# Patient Record
Sex: Female | Born: 1965 | Race: White | Hispanic: No | Marital: Married
Health system: Midwestern US, Academic
[De-identification: ages and names within clinical notes are randomized; demographics above are authoritative.]

---

## 2021-07-30 ENCOUNTER — Encounter: Admit: 2021-07-30 | Discharge: 2021-07-30 | Payer: MEDICARE

## 2021-07-31 ENCOUNTER — Encounter: Admit: 2021-07-31 | Discharge: 2021-07-31 | Payer: MEDICARE

## 2021-07-31 DIAGNOSIS — C20 Malignant neoplasm of rectum: Secondary | ICD-10-CM

## 2021-07-31 NOTE — Progress Notes
H&E Slides of ACC # [id number]/09/20 & [id number]/16/21, requested on 07/31/21 from [facility].    Contact for outside path lab is [phone].      FED EX shipping label faxed (fax # [phone]) with shipment tracking # [phone].

## 2021-07-31 NOTE — Telephone Encounter
Navigation Intake Assessment Document    Patient Name:  Terri Eaton  DOB:  Aug 24, 1965  Insurance:   [facility] Medicare     Appointment Info:   Future Appointments   Date Time Provider Department Center   08/16/2021  1:30 PM [place], Humberto Leep, DO CCC2 [place] Exam     Diagnosis & Reason for Visit:  Hx rectal cancer, needing Dr. Kathee Delton opinion.     Physician Info:   ? Referring Physician: Dr Halina Andreas     Location of Films:  IMAGE VIEWER    Location of Pathology:  OUtside path requested for review and Patient notified outside pathology slides will be obtained for review by [facility] pathologist and a facility and professional fee will be billed to their insurance.     11/09/2020 [id number] H & E     05/16/2021 [id number] H &E    History of Present Illness:  Hx rectal cancer. Case presented in tumor board at [facility]. Referred to Dr. Kathee Delton for opinion.     11/09/2020 colonoscopy large polypoid mass proximal to the dentate line.   Path Invasive moderately differentiated adenocarcinoma.     12/14/2020 CT CAP    12/11/2020 Oncology Consult     12/14/2020 MRI abdomen     01/11/2021 Radiation onc consult     05/10/2021 MRI Pelvis     05/16/2021 Exam under anesthesia abnormal mucosa   Path :   Final Diagnosis     RECTAL MUCOSA, 9:00, EXCISION:     Tubular adenoma    RECTAL MUCOSA, 7:00, EXCISION:     Tubular adenoma and regions of serrated adenoma    RECTAL MUCOSA, 9:00, DISTAL DENTATE LINE EXCISION:     Tubular adenoma and serrated adenoma    RECTAL MUCOSA, 6:00 AT THE DENTATE LINE, EXCISION:     Serrated adenoma and tubular adenoma    RECTAL MUCOSA, 4:00 AT DENTATE LINE, EXCISION:     Serrated adenoma    RECTAL MUCOSA, 10:00, EXCISION:     Tubular adenoma and serrated adenoma  ?  COMMENT: Sections show low-grade dysplasia throughout in the form of tubular adenoma and sessile serrated adenoma.  No residual invasive carcinoma is identified.  No high-grade dysplasia is identified.  The adenomatous processes involve the majority of each of the specimens although normal mucosa is noted in some of the specimens.         Allergies reviewed and verified with the patient, and documented in Epic:  No    NEEDS Assessment:    Genetic Counseling:  Genetic Assessment: GI             Nutrition:  Current Weight (in pounds): 246  Recent Weight Loss Without Trying?: No       Social & Financial:  Social and Financial Assessment: No needs identified  Tobacco assessment last 30 days: Patient has not used tobacco products within the last 30 days       Spiritual & Emotional:  Spiritual and Emotional Assessment: No needs identified       Physical:  Fall Risk: None identified       Communication:  Communication Barrier: No       Onc Fertility:   Onc Fertility Assessment: Discussion not appropriate at this time

## 2021-08-02 ENCOUNTER — Encounter: Admit: 2021-08-02 | Discharge: 2021-08-02 | Payer: MEDICARE

## 2021-08-02 DIAGNOSIS — C20 Malignant neoplasm of rectum: Principal | ICD-10-CM

## 2021-08-06 ENCOUNTER — Encounter: Admit: 2021-08-06 | Discharge: 2021-08-06 | Payer: MEDICARE

## 2021-08-06 DIAGNOSIS — C189 Malignant neoplasm of colon, unspecified: Secondary | ICD-10-CM

## 2021-08-09 ENCOUNTER — Encounter: Admit: 2021-08-09 | Discharge: 2021-08-09 | Payer: MEDICARE

## 2021-08-09 DIAGNOSIS — C20 Malignant neoplasm of rectum: Secondary | ICD-10-CM

## 2021-08-09 DIAGNOSIS — C189 Malignant neoplasm of colon, unspecified: Secondary | ICD-10-CM

## 2021-08-16 ENCOUNTER — Encounter: Admit: 2021-08-16 | Discharge: 2021-08-16 | Payer: MEDICARE

## 2021-08-16 ENCOUNTER — Ambulatory Visit: Admit: 2021-08-16 | Discharge: 2021-08-16 | Payer: MEDICARE

## 2021-08-16 DIAGNOSIS — C20 Malignant neoplasm of rectum: Secondary | ICD-10-CM

## 2021-08-16 DIAGNOSIS — M1990 Unspecified osteoarthritis, unspecified site: Secondary | ICD-10-CM

## 2021-08-16 DIAGNOSIS — C189 Malignant neoplasm of colon, unspecified: Secondary | ICD-10-CM

## 2021-08-16 DIAGNOSIS — M549 Dorsalgia, unspecified: Secondary | ICD-10-CM

## 2021-08-16 LAB — CBC
HEMATOCRIT: 37 % (ref 36–45)
MCH: 28 pg (ref 26–34)
WBC COUNT: 8.6 K/UL (ref 4.5–11.0)

## 2021-08-16 LAB — COMPREHENSIVE METABOLIC PANEL
ALK PHOSPHATASE: 72 U/L (ref 25–110)
ALT: 25 U/L (ref 7–56)
ANION GAP: 6 M/UL (ref 3–12)
CALCIUM: 8.9 mg/dL — ABNORMAL HIGH (ref 8.5–10.6)
CO2: 29 MMOL/L (ref 21–30)
GLUCOSE,PANEL: 98 mg/dL (ref 70–100)
POTASSIUM: 3.7 MMOL/L (ref 3.5–5.1)
SODIUM: 139 MMOL/L (ref 137–147)
TOTAL BILIRUBIN: 0.5 mg/dL (ref 0.3–1.2)
TOTAL PROTEIN: 7 g/dL (ref 6.0–8.0)

## 2021-08-16 MED ORDER — SODIUM CHLORIDE 0.9 % IV SOLP
250 mL | INTRAVENOUS | 0 refills
Start: 2021-08-16 — End: ?

## 2021-08-16 NOTE — Patient Instructions
[facility]  Surgery Instructions    Surgery Date: 08/20/21  (Please be aware that this appointment for your surgery will not be visible in the MyChart portal.)    Stop Mobic/ibuprofen/aspirin/aleve and bring CPAP day of surgery  Location:  Your surgery will be held at [facility] ([address], [place], [place] [address].)  Where to park on the day of surgery: [employer] [address] [address] (located just [place] [address] on [address])  Where to check in on the day of surgery: In the Admitting office (located on Level 1 of hospital of the [employer] [facility])      Arrival Time:   The OR scheduling office must finalize the OR schedule before we can provide a time for your surgery. Please do not call your physicians office about surgery arrival time. Your surgery start time and arrive times will be called out to you the day before your procedure between 2pm-4:30pm. If you have not heard from the hospital regarding your surgery time by 4:30PM the evening before your surgery, please call [phone].  The day of the procedure, if you cannot keep your appointment, or are delayed, please contact the surgery center immediately at [phone].      COVID-19 Testing:   No testing needed for planned surgery. If you develop any COVID-19 symptoms within 14 days of your procedure, please call [phone]. Ask to be connected to [employer] (Nurse for Dr. Kathee Delton). .     Symptoms of COVID-19 may include any of the following:  Fever or chills, Cough, Shortness of breath or difficulty breathing, Fatigue, Muscle or body aches, Headache, New loss of taste or smell, Sore throat, Congestion or runny nose, Nausea or vomiting, Diarrhea    Eating and drinking:   You must be on a clear liquid beginning the day before to surgery. Beginning at 11pm the night prior to surgery, please limit your fluid intake to only sips or small amounts (1-2oz) of water and clear liquids. ALL CLEAR LIQUIDS AND WATER must be stopped 2 hours before you need to arrive to the hospital the day of your surgery. (Be aware, you can take a small sip of water with medications the morning of surgery, if necessary.)    Bowel Prep:   MIRALAX BOWEL PREP for Procedure    This will be completed the day prior to your scheduled procedure/surgery.    Shopping List: (Purchase these over the counter supplies at least a day or two before the prep)    1)  64 ounces of a clear liquid (Gatorade Ice, Gingerale, Sprite, etc. Avoid Red, Orange & Purple)  2)  1 bottles of the 238g sized bottle of Miralax Powder   3)  8 tablets of Dulcolax    **The entire day before the procedure, beginning with your first meal of the day, drink a clear liquid diet only:          DO NOT EAT ANY SOLID or CREAMED FOODS.  Clear liquids include:  Coffee (no creamers or milk), Tea, Water  flavored drinks, Gatorade, juices (No RED ORANGE or PURPLE in color)  broth, bouillon, jello (No RED, ORANGE or PURPLE in color)    At 12 noon, take 4 tablets of Dulcolax by mouth.  At 3 PM, take 4 tablets of Dulcolax by mouth.  At 5 PM mix the entire bottle (238g) of Miralax with all 64oz of clear liquid of choice.  Try to finish drinking the entire 64 ounces of Miralax mixture in about 2 hours- this equates to drinking an 8oz glass every ten minutes.  If you need more time, take it. We would prefer you to finish the entire prep than to stop part way through.      Continue to drink lots of clear liquids to stay well hydrated and protect your kidneys.  Your urine should be light yellow.  If your urine becomes dark yellow you are not drinking enough. STOP drinking EVERYTHING 2 hours prior to your arrival to the hospital the day of surgery/procedure.    Hygiene:    Shower the night before and the morning of your procedure , you will not need to perform your showers with any special soaps, just be sure you clean on the day of your surgery.    Personal Items:    Do not wear makeup, fingernail polish, lotion, deodorant, or perfume  Remove all metal jewelry and piercings.   Bring a Retail buyer for your glasses, contacts, hearing aids, dentures, etc.     Thing to Bring:    Personal identification (ID)   Insurance card    Medications:    You will be given medication instructions by the Pre-Operative Assessment Clinic [facility]). If you have NOT been scheduled a Pre-Operative Assessment appointment, you will receive a phone call to be ?triaged? by the anesthesia team. Please be sure to speak with the anesthesia team or attend your [facility] appointment if setup for you. If you do not, this may result in cancellation or delay of your surgery.  If you did not receive medication instructions, or have questions about any of your current medications, as it relates to the day of your surgery, please call [phone].    Transportation/Visitor Policy:    During the COVID-19 crisis, for the safety of our patients, the health system has limited visitors.  Please be aware of the following:     You are having an outpatient surgery, a responsible adult you know and trust is required to drive you home and stay with you for 12-24 hours after surgery. If you do not have this arranged prior to surgery, your surgery may be delayed or cancelled. Remember, you are not allowed to drive for 24 hours after surgery.  A maximum of 2 total visitors are allowed during your hospital admission each day.   During the time of your surgery, due to the size of the perioperative waiting room, only 1 visitor is allowed in the designated waiting room.  If you have 2 visitors during the time of your surgery, please know your visitors will be asked to find a place to wait in the hospital lobby or cafeteria and will be notified by the nurse liaison team, when you are out of surgery.  No visitors are allowed in pre/post patient care areas until a patient is transferred to a patient room.  If you would like more than 1 visitor to be at the hospital during surgery, please be aware you are only allowed   No visitors are allowed for patients with active COVID-19 infections.    All visitors must:     Wear a mask at all times until further notice.  Maintain physical distancing of at least six feet from others.  Wash hands or use hand sanitizer when entering or exiting patient rooms.   Any visitors who have a fever or other cold- or flu-like symptoms will not be allowed in [facility]  [employer] facilities.     Thank you for understanding our visitation policies. We will continue to evaluate our recommendations and keep you updated on revisions based on public health guidance.       FMLA:   If you have any FMLA or Short Term Disability Paperwork needs, you may have these faxed to [phone]- Attention to Marni Griffon, RN. Be sure your name and birthday are on the forms, along with a return fax number for this office to return them to once completed. Please allow up to 7-10 business days for completion of paperwork.     Billing Questions:   A member of the business office will verify your medical benefits with your insurance company prior to your surgery. If you have questions about understanding the cost of your care please call the Financial Customer Service at [phone].       If you have questions, please call [phone]. Ask to be connected to [employer] (Nurse for Dr. Kathee Delton).     If your needs are URGENT and it?s after hours   please call [phone] and have the on call provider with Surgical Oncology paged.

## 2021-08-19 ENCOUNTER — Encounter: Admit: 2021-08-19 | Discharge: 2021-08-19 | Payer: MEDICARE

## 2021-08-20 ENCOUNTER — Encounter: Admit: 2021-08-20 | Discharge: 2021-08-20 | Payer: MEDICARE

## 2021-08-20 ENCOUNTER — Ambulatory Visit: Admit: 2021-08-20 | Discharge: 2021-08-20 | Payer: MEDICARE

## 2021-08-20 DIAGNOSIS — M1990 Unspecified osteoarthritis, unspecified site: Secondary | ICD-10-CM

## 2021-08-20 DIAGNOSIS — M549 Dorsalgia, unspecified: Secondary | ICD-10-CM

## 2021-08-20 DIAGNOSIS — C189 Malignant neoplasm of colon, unspecified: Secondary | ICD-10-CM

## 2021-08-20 MED ORDER — PROPOFOL INJ 10 MG/ML IV VIAL
INTRAVENOUS | 0 refills | Status: DC
Start: 2021-08-20 — End: 2021-08-20
  Administered 2021-08-20: 17:00:00 150 mg via INTRAVENOUS
  Administered 2021-08-20: 17:00:00 50 mg via INTRAVENOUS

## 2021-08-20 MED ORDER — ARTIFICIAL TEARS (PF) SINGLE DOSE DROPS GROUP
OPHTHALMIC | 0 refills | Status: DC
Start: 2021-08-20 — End: 2021-08-20
  Administered 2021-08-20: 17:00:00 1 [drp] via OPHTHALMIC

## 2021-08-20 MED ORDER — PHENYLEPHRINE HCL IN 0.9% NACL 1 MG/10 ML (100 MCG/ML) IV SYRG
INTRAVENOUS | 0 refills | Status: DC
Start: 2021-08-20 — End: 2021-08-20
  Administered 2021-08-20: 17:00:00 100 ug via INTRAVENOUS
  Administered 2021-08-20: 17:00:00 200 ug via INTRAVENOUS

## 2021-08-20 MED ORDER — LACTATED RINGERS IV SOLP
INTRAVENOUS | 0 refills | Status: DC
Start: 2021-08-20 — End: 2021-08-20
  Administered 2021-08-20: 16:00:00 via INTRAVENOUS

## 2021-08-20 MED ORDER — ONDANSETRON HCL (PF) 4 MG/2 ML IJ SOLN
INTRAVENOUS | 0 refills | Status: DC
Start: 2021-08-20 — End: 2021-08-20
  Administered 2021-08-20: 17:00:00 4 mg via INTRAVENOUS

## 2021-08-20 MED ORDER — MIDAZOLAM 1 MG/ML IJ SOLN
INTRAVENOUS | 0 refills | Status: DC
Start: 2021-08-20 — End: 2021-08-20
  Administered 2021-08-20: 16:00:00 2 mg via INTRAVENOUS

## 2021-08-20 MED ORDER — LIDOCAINE (PF) 200 MG/10 ML (2 %) IJ SYRG
INTRAVENOUS | 0 refills | Status: DC
Start: 2021-08-20 — End: 2021-08-20
  Administered 2021-08-20: 17:00:00 100 mg via INTRAVENOUS

## 2021-08-20 MED ORDER — DEXAMETHASONE SODIUM PHOSPHATE 4 MG/ML IJ SOLN
INTRAVENOUS | 0 refills | Status: DC
Start: 2021-08-20 — End: 2021-08-20
  Administered 2021-08-20: 17:00:00 4 mg via INTRAVENOUS

## 2021-08-20 MED ADMIN — BUPIVACAINE HCL 0.25 % (2.5 MG/ML) IJ SOLN [1222]: 10 mL | INTRAMUSCULAR | @ 17:00:00 | Stop: 2021-08-20 | NDC 63323046501

## 2021-08-20 MED ADMIN — SODIUM CHLORIDE 0.9 % IV SOLP [27838]: 250 mL | INTRAVENOUS | @ 16:00:00 | Stop: 2021-08-20 | NDC 00338004902

## 2021-08-20 MED ADMIN — LIDOCAINE HCL 10 MG/ML (1 %) IJ SOLN [4452]: 10 mL | INTRAMUSCULAR | @ 17:00:00 | Stop: 2021-08-20 | NDC 00409427617

## 2021-08-20 MED FILL — HYDROCODONE-ACETAMINOPHEN 5-325 MG PO TAB: 5/325 mg | ORAL | 3 days supply | Qty: 10 | Fill #1 | Status: CP

## 2021-08-22 ENCOUNTER — Encounter: Admit: 2021-08-22 | Discharge: 2021-08-22 | Payer: MEDICARE

## 2021-08-22 DIAGNOSIS — M549 Dorsalgia, unspecified: Secondary | ICD-10-CM

## 2021-08-22 DIAGNOSIS — C189 Malignant neoplasm of colon, unspecified: Secondary | ICD-10-CM

## 2021-08-22 DIAGNOSIS — M1990 Unspecified osteoarthritis, unspecified site: Secondary | ICD-10-CM

## 2021-09-03 NOTE — Progress Notes
..This prechart is intended to be a reference for patient appointments. Information is gathered from in chart as well as external records review. Information will be clarified/verified/updated in final documentation in the office visit.     Pre Clinic Pre Chart:    Terri Eaton is a 3F with a history of rectal cancer just above the dentate line that has been previously trans-anally excised with residual adenomatous tumor. She underwent a repeat transanal excision on 08/20/2021. 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 It was removed in pieces and cautery limited margin evaluation.     Problem List:      08/20/2021 - 1. Anorectal exam under anesthesia 2. Transanal excision of rectal tumor complex and circumferential  Findings:  Circumferential rectal tumor, excised in pieces.  Pathology:  A. 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 See comment.     Comment:   There is a small focus of intramucosal adenocarcinoma with prominent   cautery artifact that partly limits evaluation. No definitive submucosal   invasion is identified.     CASE SUMMARY: (COLON AND RECTUM: Resection, Including Transanal Disk   Excision of Rectal Neoplasms)   CAP Version: Colon and Rectum Resection 4.2.0.2   Standard(s): AJCC-UICC 8     SPECIMEN     Procedure   ___ Transanal disk excision (local excision)     Macroscopic Evaluation of Mesorectum (required for rectal cancers)   ___ Not applicable     TUMOR     Tumor Site (select all that apply)   ___ Rectum: _________________     Histologic Type   ___ Adenocarcinoma, intramucosal     Histologic Grade   ___ G2, moderately differentiated     Tumor Size   ___ Greatest dimension in Centimeters (cm): 0.25 cm of intramucosal   adenocarcinoma in this material     Multiple Primary Sites (e.g., hepatic flexure and transverse colon)   ___ Not applicable (no additional primary site(s) present)     Tumor Extent   ___ Invades lamina propria / muscularis mucosae (intramucosal carcinoma)     Macroscopic Tumor Perforation   ___ Not identified     Lymphovascular Invasion(select all that apply)   ___ Not identified     Perineural Invasion   ___ Not identified     +Type of Polyp in which Invasive Carcinoma Arose   ___ Tubulovillous adenoma     Treatment Effect   ___ No known presurgical therapy     MARGINS     Margin Status for Invasive Carcinoma   The fragmented nature of the specimen precludes assessment of mucosal   margins. The deep margin is uninvolved (2.5 mm away).     REGIONAL LYMPH NODES     Regional Lymph Node Status   ___ Not applicable (no regional lymph nodes submitted or found)     Tumor Deposits   ___Not applicable     DISTANT METASTASIS     Distant Site(s) Involved, if applicable(select all that apply)   ___ Not applicable     PATHOLOGIC STAGE CLASSIFICATION (pTNM, AJCC 8th Edition): pTis   Reporting of pT, pN, and (when applicable) pM categories is based on   information available to the pathologist at the time the report is issued.   As per the AJCC (Chapter 1, 8th Ed.) it is the managing physician's   responsibility to establish the  final pathologic stage based upon all   pertinent information, including but potentially not limited to this   pathology report.     TNM Descriptors(select all that apply)   ___ Not applicable     pT Category   ___ pTis: Carcinoma in situ, intramucosal carcinoma (involvement of lamina   propria with no extension through muscularis mucosae)     pN Category   ___ pN not assigned (no nodes submitted or found)     pM Category (required only if confirmed pathologically)   ___ Not applicable - pM cannot be determined from the submitted   specimen(s)     ADDITIONAL FINDINGS     +Additional Findings(select all that apply)   ___ None identified     SPECIAL STUDIES   A desmin immunostain on block A3 highlights the muscularis mucosa.     COMMENTS   Pursuant to the Quality Assurance Program at the [facility] Pathology Department, selected slides from this case have been   concurrently reviewed by the following pathologist: Dr. Georgiann Cocker who   agrees with the final diagnosis. ?

## 2021-09-09 ENCOUNTER — Encounter: Admit: 2021-09-09 | Discharge: 2021-09-09 | Payer: MEDICARE

## 2021-09-09 ENCOUNTER — Ambulatory Visit: Admit: 2021-09-09 | Discharge: 2021-09-09 | Payer: MEDICARE

## 2021-09-09 DIAGNOSIS — D369 Benign neoplasm, unspecified site: Secondary | ICD-10-CM

## 2021-09-09 DIAGNOSIS — C210 Malignant neoplasm of anus, unspecified: Secondary | ICD-10-CM

## 2021-09-09 DIAGNOSIS — M1990 Unspecified osteoarthritis, unspecified site: Secondary | ICD-10-CM

## 2021-09-09 DIAGNOSIS — M549 Dorsalgia, unspecified: Secondary | ICD-10-CM

## 2021-09-09 DIAGNOSIS — C189 Malignant neoplasm of colon, unspecified: Secondary | ICD-10-CM

## 2021-09-09 MED ORDER — SODIUM CHLORIDE 0.9 % IV SOLP
250 mL | INTRAVENOUS | 0 refills
Start: 2021-09-09 — End: ?

## 2021-09-09 NOTE — Progress Notes
Date of Service: 09/09/2021    Subjective:             Terri Eaton is a 56 y.o. female.    History of Present Illness  Terri Eaton is a 59F with a history of rectal cancer just above the dentate line that has been previously trans-anally excised with residual adenomatous tumor. She underwent a repeat transanal excision on 08/20/2021. 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 It was removed in pieces and cautery limited margin evaluation.     She returns to clinic today for post operative visit. The first week was very painful. Now reporting just some tenderness. Mostly when she squeezes down there. Rinses with warm water which helps the pain. There is a little bit of bleeding. She is able to control her bowel movements. Backed off the Miralax and now takes one softener a day.     08/20/2021 - 1. Anorectal exam under anesthesia 2. Transanal excision of rectal tumor complex and circumferential  Findings:  Circumferential rectal tumor, excised in pieces.  Pathology:  A. 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 See comment.     Comment:   There is a small focus of intramucosal adenocarcinoma with prominent   cautery artifact that partly limits evaluation. No definitive submucosal   invasion is identified.     CASE SUMMARY: (COLON AND RECTUM: Resection, Including Transanal Disk   Excision of Rectal Neoplasms)   CAP Version: Colon and Rectum Resection 4.2.0.2   Standard(s): AJCC-UICC 8     SPECIMEN     Procedure   ___ Transanal disk excision (local excision)     Macroscopic Evaluation of Mesorectum (required for rectal cancers)   ___ Not applicable     TUMOR     Tumor Site (select all that apply)   ___ Rectum: _________________     Histologic Type   ___ Adenocarcinoma, intramucosal     Histologic Grade   ___ G2, moderately differentiated     Tumor Size ___ Greatest dimension in Centimeters (cm): 0.25 cm of intramucosal   adenocarcinoma in this material     Multiple Primary Sites (e.g., hepatic flexure and transverse colon)   ___ Not applicable (no additional primary site(s) present)     Tumor Extent   ___ Invades lamina propria / muscularis mucosae (intramucosal carcinoma)     Macroscopic Tumor Perforation   ___ Not identified     Lymphovascular Invasion(select all that apply)   ___ Not identified     Perineural Invasion   ___ Not identified     +Type of Polyp in which Invasive Carcinoma Arose   ___ Tubulovillous adenoma     Treatment Effect   ___ No known presurgical therapy     MARGINS     Margin Status for Invasive Carcinoma   The fragmented nature of the specimen precludes assessment of mucosal   margins. The deep margin is uninvolved (2.5 mm away).     REGIONAL LYMPH NODES     Regional Lymph Node Status   ___ Not applicable (no regional lymph nodes submitted or found)     Tumor Deposits   ___Not applicable     DISTANT METASTASIS     Distant Site(s) Involved, if applicable(select all that apply)   ___ Not applicable     PATHOLOGIC STAGE CLASSIFICATION (pTNM, AJCC 8th Edition): pTis   Reporting  of pT, pN, and (when applicable) pM categories is based on   information available to the pathologist at the time the report is issued.   As per the AJCC (Chapter 1, 8th Ed.) it is the managing physician's   responsibility to establish the final pathologic stage based upon all   pertinent information, including but potentially not limited to this   pathology report.     TNM Descriptors(select all that apply)   ___ Not applicable     pT Category   ___ pTis: Carcinoma in situ, intramucosal carcinoma (involvement of lamina   propria with no extension through muscularis mucosae)     pN Category   ___ pN not assigned (no nodes submitted or found)     pM Category (required only if confirmed pathologically)   ___ Not applicable - pM cannot be determined from the submitted specimen(s)     ADDITIONAL FINDINGS     +Additional Findings(select all that apply)   ___ None identified     SPECIAL STUDIES   A desmin immunostain on block A3 highlights the muscularis mucosa.     COMMENTS   Pursuant to the Quality Assurance Program at the [facility] Pathology Department, selected slides from this case have been   concurrently reviewed by the following pathologist: Dr. Georgiann Cocker who   agrees with the final diagnosis. ?       Medical History:   Diagnosis Date   ? Arthritis    ? Back pain    ? Cancer of colon [facility])      Surgical History:   Procedure Laterality Date   ? TRANSANAL EXCISION RECTAL TUMOR - FULL THICKNESS N/A 08/20/2021    Performed by Costella Hatcher, DO at [facility] OR   ? HX CARPAL TUNNEL RELEASE Bilateral    ? KNEE REPLACEMENT Right      No family history on file.  Social History     Socioeconomic History   ? Marital status: Married   Tobacco Use   ? Smoking status: Never   ? Smokeless tobacco: Never   Vaping Use   ? Vaping Use: Never used   Substance and Sexual Activity   ? Alcohol use: Yes     Alcohol/week: 1.0 standard drink     Types: 1 Cans of beer per week   ? Drug use: Never     Vaping/E-liquid Use   ? Vaping Use Never User                    Review of Systems   Constitutional: Positive for unexpected weight change. Negative for chills and fever.   HENT: Negative.    Eyes: Negative.    Respiratory: Negative.    Cardiovascular: Negative.    Gastrointestinal: Negative.  Negative for abdominal distention, abdominal pain, anal bleeding, blood in stool, constipation, diarrhea, nausea, rectal pain and vomiting.   Endocrine: Negative.    Genitourinary: Negative.    Musculoskeletal: Positive for arthralgias.   Skin: Negative.  Negative for color change and wound.   Allergic/Immunologic: Negative.    Neurological: Negative.  Negative for weakness and light-headedness.   Hematological: Negative.    Psychiatric/Behavioral: Negative.    All other systems reviewed and are negative.        Objective:         ? diphenhydrAMINE hcl (BENADRYL ALLERGY) 25 mg tablet Benadryl Allergy   ? fexofenadine (ALLEGRA) 180 mg tablet Take 180 mg by mouth at  bedtime daily.   ? gabapentin (NEURONTIN) 300 mg capsule Take 600 mg by mouth at bedtime daily.   ? HYDROcodone/acetaminophen (NORCO) 5/325 mg tablet Take one tablet by mouth every 6 hours as needed for Pain.   ? losartan (COZAAR) 50 mg tablet TAKE ONE (1) TABLET BY MOUTH ONCE DAILY for 90   ? meloxicam (MOBIC) 7.5 mg tablet Take 1 tablet by mouth twice daily.   ? pantoprazole DR (PROTONIX) 40 mg tablet TAKE ONE (1) TABLET BY MOUTH ONCE DAILY for 90   ? polyethylene glycol 3350 (MIRALAX) 17 g packet Take one packet by mouth daily.   ? promethazine (PHENERGAN) 25 mg tablet Take 25 mg by mouth every 6 hours as needed.   ? sertraline (ZOLOFT) 100 mg tablet Take 1 tablet by mouth daily.   ? tiZANidine (ZANAFLEX) 4 mg capsule Take 4 mg by mouth at bedtime daily.     Vitals:    09/09/21 0935   BP: 117/53   BP Source: Arm, Left Upper   Pulse: 70   Temp: 36.3 ?C (97.4 ?F)   Resp: 18   SpO2: 98%   TempSrc: Temporal   PainSc: Zero   Weight: 115 kg (253 lb 9.6 oz)     Body mass index is 46.38 kg/m?Marland Kitchen     Physical Exam  Vitals reviewed.   Constitutional:       General: She is not in acute distress.     Appearance: Normal appearance. She is well-developed.   HENT:      Head: Normocephalic and atraumatic.      Nose: Nose normal.   Eyes:      General: Lids are normal.      Conjunctiva/sclera: Conjunctivae normal.   Pulmonary:      Effort: Pulmonary effort is normal. No respiratory distress.   Musculoskeletal:         General: Normal range of motion.      Cervical back: Normal range of motion.   Neurological:      Mental Status: She is alert and oriented to person, place, and time.   Psychiatric:         Speech: Speech normal.         Behavior: Behavior normal.         Thought Content: Thought content normal.         Judgment: Judgment normal. Assessment and Plan:  62F with a history of rectal cancer previously trans-anally excised with residual adenomatous tumor. Now s/p repeat transanal excision on 08/20/2021. 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 It was removed in pieces and cautery limited margin evaluation.     Post-Operative Care:  - Discussed pain management strategies at this point post operatively. Recticare, tylenol, or ibuprofen. Continue with warm rinses.  - Ideal bowel habits reviewed.   - Pathology reviewed.    Intramucosal TVA Margins Unclear:  - Plan to repeat transanal excision in ~8wks.   - CONSENT DAY OF SURGERY.    Plan:  - RTC post operatively.     .The patient and family were allowed to ask questions and voice concerns; these were addressed to the best of our ability. They expressed understanding of what was explained to them, and they agreed with the present plan. Patient has the phone numbers for the Cancer Center and was instructed on how to contact us with any questions or concerns.    My collaborating provider on this patient is Dr. Jonny Ruiz Ashcraft DO.  Brunilda Payor. Fredricka Bonine MSN, APRN, AGCNS-BC, AGPCNP-BC  Advanced Practice Provider  Colon and Rectal Surgery  Surgical Oncology  APP for Dr. Kathee Delton, Dr. Daphine Deutscher and Dr. Daiva Nakayama  Pager: [phone]  Available via Amie Critchley and [facility] Connect

## 2021-09-09 NOTE — Patient Instructions
.Thank you for visiting me today. Your time is important and if you had to wait at all today, I apologize. My goal is to run exactly on time; however, on occasion, I get behind in clinic due to unexpected patient issues or space constraints. Please use the nurses line below if you have any questions or concerns at any time. Leave a message if necessary and your call will be returned. Feel free to send [place] a MyChart message as well. Messages are reviewed Monday through Friday 8am to 4pm. If it is after 4pm, it may be returned the following business day. Our office is closed on weekends and major holidays. If you have an urgent need after hours please call [phone] and ask for the surgical oncology resident on call to be paged.     If testing was ordered today please know it will automatically be released to your mychart. You may review this before I do. I will send you a message with this result once I have reviewed this.     Please call my nurse listed below for questions or concerns.  Paediatric nurse (Dr. Kathee Delton and Deberah Pelton APRN) - ph. [phone] fax. [phone]     Brunilda Payor. Fredricka Bonine MSN, APRN, AGCNS-BC, AGPCNP-BC  Advanced Practice Provider  Colon and Rectal Surgery  Surgical Oncology  APP for Dr. Kathee Delton, Dr. Daphine Deutscher, and Dr. Daiva Nakayama      [facility]  Surgery Instructions    Surgery Date: Monday, April 10th, 2023  (Please be aware that this appointment for your surgery will not be visible in the MyChart portal.)    Location:  Your surgery will be held at [facility] ([address] [place], [place] [address].)  Where to park on the day of surgery: [address]age (located just [place] [address] on [place])  Where to check in on the day of surgery: In the Admitting office (located on Level 1 of hospital of the [employer] [facility])      Arrival Time:   The OR scheduling office must finalize the OR schedule before we can provide a time for your surgery. Please do not call your physicians office about surgery arrival time. Your surgery start time and arrive times will be called out to you the day before your procedure between 2pm-4:30pm. If you have not heard from the hospital regarding your surgery time by 4:30PM the evening before your surgery, please call [phone].  The day of the procedure, if you cannot keep your appointment, or are delayed, please contact the surgery center immediately at [phone].      COVID-19 Testing:   No testing needed for planned surgery. If you develop any COVID-19 symptoms within 14 days of your procedure, please call [phone]. Ask to be connected to [employer] (Nurse for Dr. Kathee Delton). .     Symptoms of COVID-19 may include any of the following:  Fever or chills, Cough, Shortness of breath or difficulty breathing, Fatigue, Muscle or body aches, Headache, New loss of taste or smell, Sore throat, Congestion or runny nose, Nausea or vomiting, Diarrhea    Eating and drinking:   You must be on a clear liquid beginning the day before to surgery. Beginning at 11pm the night prior to surgery, please limit your fluid intake to only sips or small amounts (1-2oz) of water and clear liquids. ALL CLEAR LIQUIDS AND WATER must be stopped 2 hours before you need  to arrive to the hospital the day of your surgery. (Be aware, you can take a small sip of water with medications the morning of surgery, if necessary.)    Bowel Prep:   MIRALAX BOWEL PREP for Procedure    This will be completed the day prior to your scheduled procedure/surgery.    Shopping List: (Purchase these over the counter supplies at least a day or two before the prep)    1)  64 ounces of a clear liquid (Gatorade Ice, Gingerale, Sprite, etc. Avoid Red, Orange & Purple)  2)  1 bottles of the 238g sized bottle of Miralax Powder   3)  8 tablets of Dulcolax    **The entire day before the procedure, beginning with your first meal of the day, drink a clear liquid diet only:          DO NOT EAT ANY SOLID or CREAMED FOODS.  Clear liquids include:  Coffee (no creamers or milk), Tea, Water  flavored drinks, Gatorade, juices (No RED ORANGE or PURPLE in color)  broth, bouillon, jello (No RED, ORANGE or PURPLE in color)    At 12 noon, take 4 tablets of Dulcolax by mouth.  At 3 PM, take 4 tablets of Dulcolax by mouth.  At 5 PM mix the entire bottle (238g) of Miralax with all 64oz of clear liquid of choice.   Try to finish drinking the entire 64 ounces of Miralax mixture in about 2 hours- this equates to drinking an 8oz glass every ten minutes.  If you need more time, take it. We would prefer you to finish the entire prep than to stop part way through.      Continue to drink lots of clear liquids to stay well hydrated and protect your kidneys.  Your urine should be light yellow.  If your urine becomes dark yellow you are not drinking enough. STOP drinking EVERYTHING 2 hours prior to your arrival to the hospital the day of surgery/procedure.    Hygiene:    Shower the night before and the morning of your procedure , you will not need to perform your showers with any special soaps, just be sure you clean on the day of your surgery.    Personal Items:    Do not wear makeup, fingernail polish, lotion, deodorant, or perfume  Remove all metal jewelry and piercings.   Bring a Retail buyer for your glasses, contacts, hearing aids, dentures, etc.     Thing to Bring:    Personal identification (ID)   Insurance card    Medications:    You will be given medication instructions by the Pre-Operative Assessment Clinic [facility]). If you have NOT been scheduled a Pre-Operative Assessment appointment, you will receive a phone call to be ?triaged? by the anesthesia team. Please be sure to speak with the anesthesia team or attend your [facility] appointment if setup for you. If you do not, this may result in cancellation or delay of your surgery.  If you did not receive medication instructions, or have questions about any of your current medications, as it relates to the day of your surgery, please call [phone].    Transportation/Visitor Policy:    During the COVID-19 crisis, for the safety of our patients, the health system has limited visitors.  Please be aware of the following:     You are having an outpatient surgery, a responsible adult you know and trust is required to drive you home and stay with you for 12-24 hours  after surgery. If you do not have this arranged prior to surgery, your surgery may be delayed or cancelled. Remember, you are not allowed to drive for 24 hours after surgery.  A maximum of 2 total visitors are allowed during your hospital admission each day.   During the time of your surgery, due to the size of the perioperative waiting room, only 1 visitor is allowed in the designated waiting room.  If you have 2 visitors during the time of your surgery, please know your visitors will be asked to find a place to wait in the hospital lobby or cafeteria and will be notified by the nurse liaison team, when you are out of surgery.  No visitors are allowed in pre/post patient care areas until a patient is transferred to a patient room.  If you would like more than 1 visitor to be at the hospital during surgery, please be aware you are only allowed   No visitors are allowed for patients with active COVID-19 infections.    All visitors must:     Wear a mask at all times until further notice.  Maintain physical distancing of at least six feet from others.  Wash hands or use hand sanitizer when entering or exiting patient rooms.   Any visitors who have a fever or other cold- or flu-like symptoms will not be allowed in [facility] [place] [facility] facilities.     Thank you for understanding our visitation policies. We will continue to evaluate our recommendations and keep you updated on revisions based on public health guidance.       FMLA:   If you have any FMLA or Short Term Disability Paperwork needs, you may have these faxed to [phone]- Attention to Marni Griffon, RN. Be sure your name and birthday are on the forms, along with a return fax number for this office to return them to once completed. Please allow up to 7-10 business days for completion of paperwork.     Billing Questions:   A member of the business office will verify your medical benefits with your insurance company prior to your surgery. If you have questions about understanding the cost of your care please call the Financial Customer Service at [phone].     If you have questions, please call [phone]. Ask to be connected to [employer] (Nurse for Dr. Kathee Delton).     If your needs are URGENT and it?s after hours   please call [phone] and have the on call provider with Surgical Oncology paged.

## 2021-09-30 ENCOUNTER — Encounter: Admit: 2021-09-30 | Discharge: 2021-09-30 | Payer: MEDICARE

## 2021-10-16 ENCOUNTER — Encounter: Admit: 2021-10-16 | Discharge: 2021-10-16 | Payer: MEDICARE

## 2021-10-16 NOTE — Telephone Encounter
Pt called today. She needs to r/s her 4/10 surgery with Dr. Cena Benton to 4/17. She is having some issues with finances. I have r/s this for her.    She asked for my fax number to send me some paperwork to fill out. I have sent this to her in a mychart message. I gave her my address too if she is unable to fax.    She had no other questions.

## 2021-11-04 ENCOUNTER — Encounter: Admit: 2021-11-04 | Discharge: 2021-11-04 | Payer: MEDICARE

## 2021-11-11 ENCOUNTER — Encounter: Admit: 2021-11-11 | Discharge: 2021-11-11 | Payer: MEDICARE

## 2021-11-11 DIAGNOSIS — M549 Dorsalgia, unspecified: Secondary | ICD-10-CM

## 2021-11-11 DIAGNOSIS — M1990 Unspecified osteoarthritis, unspecified site: Secondary | ICD-10-CM

## 2021-11-11 DIAGNOSIS — C189 Malignant neoplasm of colon, unspecified: Secondary | ICD-10-CM

## 2021-11-11 MED ADMIN — MIDAZOLAM 1 MG/ML IJ SOLN [10607]: 2 mg | INTRAVENOUS | @ 19:00:00 | Stop: 2021-11-11 | NDC 00641605701

## 2021-11-11 MED ADMIN — DEXAMETHASONE SODIUM PHOSPHATE 4 MG/ML IJ SOLN [2332]: 4 mg | INTRAVENOUS | @ 19:00:00 | Stop: 2021-11-11 | NDC 63323016501

## 2021-11-11 MED ADMIN — FENTANYL CITRATE (PF) 50 MCG/ML IJ SOLN [3037]: 25 ug | INTRAVENOUS | @ 19:00:00 | Stop: 2021-11-11 | NDC 00409909425

## 2021-11-11 MED ADMIN — ARTIFICIAL TEARS (PF) SINGLE DOSE DROPS GROUP [280009]: 2 [drp] | OPHTHALMIC | @ 19:00:00 | Stop: 2021-11-11 | NDC 00065806301

## 2021-11-11 MED ADMIN — LIDOCAINE (PF) 200 MG/10 ML (2 %) IJ SYRG [307651]: 80 mg | INTRAVENOUS | @ 19:00:00 | Stop: 2021-11-11 | NDC 70092156446

## 2021-11-11 MED ADMIN — SODIUM CHLORIDE 0.9 % IV SOLP [27838]: 250 mL | INTRAVENOUS | @ 18:00:00 | Stop: 2021-11-11 | NDC 00338004902

## 2021-11-11 MED ADMIN — OXYCODONE 5 MG PO TAB [10814]: 5 mg | ORAL | @ 20:00:00 | Stop: 2021-11-11 | NDC 00904696661

## 2021-11-11 MED ADMIN — LIDOCAINE HCL 10 MG/ML (1 %) IJ SOLN [4452]: 15 mL | INTRAMUSCULAR | @ 19:00:00 | Stop: 2021-11-11 | NDC 00409427617

## 2021-11-11 MED ADMIN — FAMOTIDINE (PF) 20 MG/2 ML IV SOLN [166077]: 20 mg | INTRAVENOUS | @ 19:00:00 | Stop: 2021-11-11 | NDC 00641602201

## 2021-11-11 MED ADMIN — ONDANSETRON HCL (PF) 4 MG/2 ML IJ SOLN [136012]: 4 mg | INTRAVENOUS | @ 19:00:00 | Stop: 2021-11-11 | NDC 25021077702

## 2021-11-11 MED ADMIN — BUPIVACAINE HCL 0.25 % (2.5 MG/ML) IJ SOLN [1222]: 15 mL | INTRAMUSCULAR | @ 19:00:00 | Stop: 2021-11-11 | NDC 63323046501

## 2021-11-11 MED ADMIN — PROPOFOL INJ 10 MG/ML IV VIAL [210331]: 150 mg | INTRAVENOUS | @ 19:00:00 | Stop: 2021-11-11 | NDC 25021060820

## 2021-11-11 MED FILL — HYDROCODONE-ACETAMINOPHEN 5-325 MG PO TAB: 5/325 mg | ORAL | 4 days supply | Qty: 15 | Fill #1 | Status: CP

## 2021-11-13 ENCOUNTER — Encounter: Admit: 2021-11-13 | Discharge: 2021-11-13 | Payer: MEDICARE

## 2021-11-13 DIAGNOSIS — M1990 Unspecified osteoarthritis, unspecified site: Secondary | ICD-10-CM

## 2021-11-13 DIAGNOSIS — C189 Malignant neoplasm of colon, unspecified: Secondary | ICD-10-CM

## 2021-11-13 DIAGNOSIS — M549 Dorsalgia, unspecified: Secondary | ICD-10-CM

## 2021-12-02 ENCOUNTER — Encounter: Admit: 2021-12-02 | Discharge: 2021-12-02 | Payer: MEDICARE

## 2021-12-02 NOTE — Telephone Encounter
Terri Eaton called to state she is unable to come in this morning.  She told Rhonda up front at [facility] that her husband has insomnia and would not wake up to drive her in.  I called patient and rescheduled to Thurs 5/18 with Dr. Cena Benton.

## 2021-12-12 ENCOUNTER — Ambulatory Visit: Admit: 2021-12-12 | Discharge: 2021-12-12 | Payer: MEDICARE

## 2021-12-12 ENCOUNTER — Encounter: Admit: 2021-12-12 | Discharge: 2021-12-12 | Payer: MEDICARE

## 2021-12-12 DIAGNOSIS — D369 Benign neoplasm, unspecified site: Secondary | ICD-10-CM

## 2021-12-12 DIAGNOSIS — C20 Malignant neoplasm of rectum: Secondary | ICD-10-CM

## 2021-12-12 MED ORDER — SODIUM CHLORIDE 0.9 % IV SOLP
250 mL | INTRAVENOUS | 0 refills
Start: 2021-12-12 — End: ?

## 2021-12-12 NOTE — Progress Notes
Name: Terri Eaton          MRN: [id number]      DOB: 05/25/1966      AGE: 56 y.o.   DATE OF SERVICE: 12/12/2021    Subjective:             Reason for Visit:  Post Operative Visit      Terri Eaton is a 56 y.o. female.      Cancer Staging   No matching staging information was found for the patient.    History of Present Illness    56 y.o. female with minimal bleeding.  No pain.  No fevers.  No chills.  No nausea.    Name: Terri Eaton is a 56 y.o. female??? ?DOB: 12-May-1966?????? ???? ?MRN#: [id number]  DATE OF OPERATION: 08/20/2021  ?  Date:  08/20/2021      ?  Pre-Operative Dx:  Rectal cancer (HCC) [C20]  ?  Post-Operative Dx:  Post-op Diagnosis      * Rectal cancer (HCC) [C20]  ?  Procedure(s):  1. Anorectal exam under anesthesia  2. Transanal excision of rectal tumor complex and circumferential  ?    Name: Terri Eaton is a 56 y.o. female??? ?DOB: February 25, 1966?????? ???? ?MRN#: [id number]  DATE OF OPERATION: 11/11/2021  ?  Date:  11/11/2021      ?  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  Procedure(s):  1. Transanal excision of rectal tumor  2. Dilation of anal stricture  ?  Anesthesia Type: General    Medical History:   Diagnosis Date   ? Arthritis    ? Back pain    ? Cancer of colon [facility])      Surgical History:   Procedure Laterality Date   ? TRANSANAL EXCISION RECTAL TUMOR - FULL THICKNESS N/A 08/20/2021    Performed by Costella Hatcher, DO at [facility] OR   ? TRANSANAL EXCISION RECTAL TUMOR - FULL THICKNESS N/A 11/11/2021    Performed by Costella Hatcher, DO at [facility] OR   ? HX CARPAL TUNNEL RELEASE Bilateral    ? KNEE REPLACEMENT Right      No family history on file.  Social History     Socioeconomic History   ? Marital status: Married   Tobacco Use   ? Smoking status: Never   ? Smokeless tobacco: Never   Vaping Use   ? Vaping Use: Never used Substance and Sexual Activity   ? Alcohol use: Yes     Alcohol/week: 1.0 standard drink of alcohol     Types: 1 Cans of beer per week   ? Drug use: Never     Vaping/E-liquid Use   ? Vaping Use Never User                    Review of Systems   Constitutional: Negative for activity change, chills, fatigue and fever.   HENT: Negative for congestion.    Eyes: Negative.    Respiratory: Negative for cough, choking, chest tightness and shortness of breath.    Cardiovascular: Negative for chest pain and leg swelling.   Gastrointestinal: Negative for abdominal distention, abdominal pain, anal bleeding, blood in stool, constipation, diarrhea, nausea, rectal pain and vomiting.   Endocrine: Negative for polydipsia and polyphagia.   Genitourinary:  Negative for difficulty urinating, dysuria and frequency.   Musculoskeletal: Negative for arthralgias and myalgias.   Skin: Negative for pallor and rash.   Neurological: Negative for dizziness and light-headedness.   Hematological: Negative for adenopathy. Does not bruise/bleed easily.   Psychiatric/Behavioral: Negative for agitation, behavioral problems and confusion.         Objective:         ? benzonatate (TESSALON) 200 mg capsule Take one capsule by mouth every 8 hours.   ? diphenhydrAMINE hcl (BENADRYL ALLERGY) 25 mg tablet Benadryl Allergy   ? fexofenadine (ALLEGRA) 180 mg tablet Take one tablet by mouth at bedtime daily.   ? gabapentin (NEURONTIN) 300 mg capsule Take two capsules by mouth at bedtime daily.   ? HYDROcodone/acetaminophen (NORCO) 5/325 mg tablet Take one tablet by mouth every 6 hours as needed for Pain.   ? losartan (COZAAR) 50 mg tablet TAKE ONE (1) TABLET BY MOUTH ONCE DAILY for 90   ? meloxicam (MOBIC) 7.5 mg tablet Take one tablet by mouth twice daily.   ? pantoprazole DR (PROTONIX) 40 mg tablet TAKE ONE (1) TABLET BY MOUTH ONCE DAILY for 90   ? polyethylene glycol 3350 (MIRALAX) 17 g packet Take one packet by mouth daily.   ? promethazine (PHENERGAN) 25 mg tablet Take one tablet by mouth every 6 hours as needed.   ? sertraline (ZOLOFT) 100 mg tablet Take one tablet by mouth daily.   ? tiZANidine (ZANAFLEX) 4 mg capsule Take one capsule by mouth at bedtime daily.     Vitals:    12/12/21 0859   BP: 123/60   BP Source: Arm, Left Upper   Pulse: 87   Temp: 36.2 ?C (97.1 ?F)   Resp: 14   SpO2: 97%   TempSrc: Temporal   PainSc: Seven   Weight: 113.6 kg (250 lb 6.4 oz)     Body mass index is 44.36 kg/m?Marland Kitchen     Pain Score: Seven  Pain Loc: Knee ((L))    Fatigue Scale: 6    Pain Addressed:  N/A    Patient Evaluated for a Clinical Trial: No treatment clinical trial available for this patient.     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  Able to do DRE today without issue.  A little tight but tolerated index without pain or resistance.      She will need scope in three months to re-evaluate the area and check level of stenosis.

## 2022-02-13 ENCOUNTER — Encounter: Admit: 2022-02-13 | Discharge: 2022-02-13 | Payer: MEDICARE

## 2022-03-11 ENCOUNTER — Ambulatory Visit: Admit: 2022-03-11 | Discharge: 2022-03-11 | Payer: MEDICARE

## 2022-03-11 ENCOUNTER — Encounter: Admit: 2022-03-11 | Discharge: 2022-03-11 | Payer: MEDICARE

## 2022-03-11 DIAGNOSIS — C189 Malignant neoplasm of colon, unspecified: Secondary | ICD-10-CM

## 2022-03-11 DIAGNOSIS — M1990 Unspecified osteoarthritis, unspecified site: Secondary | ICD-10-CM

## 2022-03-11 DIAGNOSIS — M549 Dorsalgia, unspecified: Secondary | ICD-10-CM

## 2022-03-11 MED ORDER — LIDOCAINE (PF) 20 MG/ML (2 %) IJ SOLN
INTRAVENOUS | 0 refills | Status: DC
Start: 2022-03-11 — End: 2022-03-11
  Administered 2022-03-11: 19:00:00 100 mg via INTRAVENOUS

## 2022-03-11 MED ORDER — PROPOFOL INJ 10 MG/ML IV VIAL
INTRAVENOUS | 0 refills | Status: DC
Start: 2022-03-11 — End: 2022-03-11
  Administered 2022-03-11: 20:00:00 10 mg via INTRAVENOUS
  Administered 2022-03-11 (×2): 20 mg via INTRAVENOUS

## 2022-03-11 MED ORDER — PROPOFOL 10 MG/ML IV EMUL 50 ML (INFUSION)(AM)(OR)
INTRAVENOUS | 0 refills | Status: DC
Start: 2022-03-11 — End: 2022-03-11
  Administered 2022-03-11: 19:00:00 120 ug/kg/min via INTRAVENOUS

## 2022-03-11 MED ORDER — PHENYLEPHRINE HCL IN 0.9% NACL 1 MG/10 ML (100 MCG/ML) IV SYRG
INTRAVENOUS | 0 refills | Status: DC
Start: 2022-03-11 — End: 2022-03-11
  Administered 2022-03-11: 20:00:00 100 ug via INTRAVENOUS

## 2022-03-11 MED ADMIN — SODIUM CHLORIDE 0.9 % IV SOLP [27838]: 250 mL | INTRAVENOUS | @ 19:00:00 | Stop: 2022-03-11 | NDC 00338004902

## 2022-03-11 MED FILL — HYDROCODONE-ACETAMINOPHEN 5-325 MG PO TAB: 5/325 mg | ORAL | 2 days supply | Qty: 5 | Fill #1 | Status: CP

## 2022-03-11 NOTE — Anesthesia Pre-Procedure Evaluation
[facility] Anesthesia Pre-Procedure Evaluation    Name: Terri Eaton      MRN: [id number]     DOB: 14-Jan-1966     Age: 56 y.o.     Sex: female   _________________________________________________________________________     Procedure Info:   Procedure Information     Date/Time: 03/11/22 1505    Procedures:       COLONOSCOPY DIAGNOSTIC WITH SPECIMEN COLLECTION BY BRUSHING/ WASHING - FLEXIBLE - 30 mins, request 1200 start      ANOSCOPY WITH DILATION    Location: [facility] OR06 / [facility] OR/Periop    Surgeons: Costella Hatcher, DO          Physical Assessment  Vital Signs (last filed in past 24 hours):  BP: 133/76 (08/15 1200)  Temp: 36.6 ?C (97.9 ?F) (08/15 1200)  Pulse: 80 (08/15 1200)  Respirations: 16 PER MINUTE (08/15 1200)  SpO2: 94 % (08/15 1200)  O2 Device: None (Room air) (08/15 1200)  Height: 160 cm (5' 3) (08/15 1200)  Weight: 113.4 kg (250 lb) (08/15 1200)      Patient History   Allergies   Allergen Reactions   ? Duloxetine ANAPHYLAXIS     Unconfirmed - from [employer]   ? Latex, Natural Rubber BLISTERS   ? Lisinopril RASH, HIVES and UNKNOWN   ? Sulfa Dyne HIVES and URTICARIA     Fever, Hives     ? Cephaeline HEADACHE        Current Medications    Medication Directions   ALBUTEROL IN Inhale  by mouth into the lungs as Needed.   benzonatate (TESSALON) 200 mg capsule Take one capsule by mouth every 8 hours.   diphenhydrAMINE hcl (BENADRYL ALLERGY) 25 mg tablet Benadryl Allergy   fexofenadine (ALLEGRA) 180 mg tablet Take one tablet by mouth at bedtime daily.   gabapentin (NEURONTIN) 300 mg capsule Take two capsules by mouth at bedtime daily.   HYDROcodone/acetaminophen (NORCO) 5/325 mg tablet Take one tablet by mouth every 6 hours as needed for Pain.   losartan (COZAAR) 50 mg tablet TAKE ONE (1) TABLET BY MOUTH ONCE DAILY for 90   meloxicam (MOBIC) 7.5 mg tablet Take one tablet by mouth twice daily.   pantoprazole DR (PROTONIX) 40 mg tablet TAKE ONE (1) TABLET BY MOUTH ONCE DAILY for 90 polyethylene glycol 3350 (MIRALAX) 17 g packet Take one packet by mouth daily.   promethazine (PHENERGAN) 25 mg tablet Take one tablet by mouth every 6 hours as needed.   sertraline (ZOLOFT) 100 mg tablet Take one tablet by mouth daily.   tiZANidine (ZANAFLEX) 4 mg capsule Take one capsule by mouth at bedtime daily.       Review of Systems/Medical History      Patient summary reviewed  Pertinent labs reviewed    PONV Screening: Non-smoker and Female sex  No history of anesthetic complications      Airway - negative        Pulmonary          Asthma (uses albuterol approx once/day; States as of 03/10/22 she has used it 2-3 times in past month)        Obstructive Sleep Apnea          Interventions: CPAP; compliant      Cardiovascular         Exercise tolerance: <4 METS (2/2 knee pain; uses wheelchair)      Beta Blocker therapy: No      Beta  blockers within 24 hours: n/a      Hypertension, well controlled              GI/Hepatic/Renal             GERD, well controlled        Bowel prep        Neuro/Psych           Psychiatric history          Depression      Musculoskeletal         Back pain      Arthritis:  osteo        Endocrine/Other             Malignancy (rectal CA; Invasive moderately differentiated adenocarcinoma. She had staging which staged the tumor at T1/T2 with no lymph node involvement):    current      Obesity: morbid obesity (BMI > 40)     Physical Exam    Airway Findings      Mallampati: III      TM distance: >3 FB      Neck ROM: full      Mouth opening: good    Dental Findings: Negative      Cardiovascular Findings:       Rhythm: regular      Abdominal Findings:       Obese    Neurological Findings:       Alert and oriented x 3       Diagnostic Tests  Hematology:   Lab Results   Component Value Date    HGB 12.2 08/16/2021    HCT 37.0 08/16/2021    PLTCT 237 08/16/2021    WBC 8.6 08/16/2021    MCV 85.0 08/16/2021    MCH 28.1 08/16/2021    MCHC 33.1 08/16/2021    MPV 7.4 08/16/2021    RDW 15.8 08/16/2021         General Chemistry:   Lab Results   Component Value Date    NA 139 08/16/2021    K 3.7 08/16/2021    CL 104 08/16/2021    CO2 29 08/16/2021    GAP 6 08/16/2021    BUN 7 08/16/2021    CR 0.76 08/16/2021    GLU 98 08/16/2021    CA 8.9 08/16/2021    ALBUMIN 4.0 08/16/2021    TOTBILI 0.5 08/16/2021      Coagulation: No results found for: PT, PTT, INR    PAC Plan    Interview: Phone Screen Interview                                        Alerts    Anesthesia Plan    ASA score: 3   Plan: MAC and general      Informed Consent  Anesthetic plan and risks discussed with patient.  Use of blood products discussed with patient      Plan discussed with: CRNA.  Comments: (I have discussed the risks/benefits/possible complications of monitored anesthesia care and general anesthesia with the patient.  These have included dental/oral injury/tooth loss during airway manipulation, nausea/vomiting, delayed/prolonged emergence, and possible cardiac or pulmonary complications.  The patient expressed understanding and wishes to proceed with monitored anesthesia care or  general anesthetic.  )

## 2022-03-11 NOTE — Anesthesia Post-Procedure Evaluation
Post-Anesthesia Evaluation    Name: Terri Eaton      MRN: [id number]     DOB: 07-08-1966     Age: 56 y.o.     Sex: female   __________________________________________________________________________     Procedure Information     Anesthesia Start Date/Time: 03/11/22 1422    Procedures:       COLONOSCOPY DIAGNOSTIC WITH SPECIMEN COLLECTION BY BRUSHING/ WASHING - FLEXIBLE (Anus) - 30 mins, request 1200 start      ANOSCOPY WITH DILATION (Anus)    Location: [facility]40 / [facility] OR/Periop    Surgeons: Costella Hatcher, DO          Post-Anesthesia Vitals  BP: 139/76 (08/15 1530)  Temp: 36.4 ?C (97.6 ?F) (08/15 1530)  Pulse: 77 (08/15 1530)  Respirations: 15 PER MINUTE (08/15 1530)  SpO2: 95 % (08/15 1530)  SpO2 Pulse: 82 (08/15 1530)  O2 Device: None (Room air) (08/15 1530)   Vitals Value Taken Time   BP 139/76 03/11/22 1530   Temp 36.4 ?C (97.6 ?F) 03/11/22 1530   Pulse 77 03/11/22 1530   Respirations 15 PER MINUTE 03/11/22 1530   SpO2 95 % 03/11/22 1530   O2 Device None (Room air) 03/11/22 1530   ABP     ART BP           Post Anesthesia Evaluation Note    Evaluation location: Pre/Post  Patient participation: recovered; patient participated in evaluation  Level of consciousness: alert    Pain score: 0  Pain management: adequate    Hydration: normovolemia  Temperature: 36.0?C - 38.4?C  Airway patency: adequate    Perioperative Events       Post-op nausea and vomiting: no PONV    Postoperative Status  Cardiovascular status: hemodynamically stable  Respiratory status: spontaneous ventilation  Follow-up needed: none  Additional comments: PACU meds given:  none    Dispo: Pt was discharged AAOx4, speaking in complete sentences, ambulates without assistance, no distress noted, accompanied by Braulio Conte Tippy's husband        Perioperative Events  There were no known notable events for this encounter.     Theresia Majors, MD, MS, CCC/SLP 03/11/2022 3:43 PM  Assistant Professor, Dept of Anesthesiology  [phone] or available by [facility]

## 2022-03-13 ENCOUNTER — Encounter: Admit: 2022-03-13 | Discharge: 2022-03-13 | Payer: MEDICARE

## 2022-03-13 DIAGNOSIS — M1990 Unspecified osteoarthritis, unspecified site: Secondary | ICD-10-CM

## 2022-03-13 DIAGNOSIS — C189 Malignant neoplasm of colon, unspecified: Secondary | ICD-10-CM

## 2022-03-13 DIAGNOSIS — M549 Dorsalgia, unspecified: Secondary | ICD-10-CM

## 2022-03-20 NOTE — Progress Notes
..  This prechart is intended to be a reference for patient appointments. Information is gathered from in chart as well as external records review. Information will be clarified/verified/updated in final documentation in the office visit.     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 She underwent a repeat transanal excision on 08/20/2021. 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 It was removed in pieces and cautery limited margin evaluation. This was transanally excised again on 11/11/2021. 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 She was also diagnosed with an anal stricture at that time.     Problem List:          03/11/2022 - 1. Colonoscopy with cold forceps polypectomy times four 2. Dilation of anal canal.  Findings:  We performed a digital rectal exam, which revealed some narrowing around the anal canal that I was able to dilate with digital exam.  Ultimately, I used the dilators of large size to dilate this area.  We then introduced the Olympus variable stiffness high-definition colonoscope transanally in slow and sequential manner advanced to the cecum.  The cecum was identified by the ileocecal valve, which we could see actively bubbling.  It should be noted her prep was poor.  There was formed stool scattered throughout the starting at the proximal descending colon, across the transverse and then the ascending colon.  We were able to visualize approximately 70% of the mucosal surfaces with irrigation, however, it was not complete.  We used pulse irrigation for visualization as much as we could.   Pathology:  A. Colonic mucosa, rectal polyp, biopsy:   Hyperplastic polyp. ?     B. Colonic mucosa, rectal polyp #2, biopsy:   Hyperplastic polyp. ?     C. Colonic mucosa, rectal polyp #3, biopsy:   Sessile serrated polyp/adenoma. ?   ? D. Colonic mucosa, rectal polyp #4, biopsy:   Hyperplastic polyp. ?     11/11/2021 - 1. Transanal excision of rectal tumor 2. Dilation of anal stricture  Findings:  Stenosed anus, initially unable to accommodate index finger. Dilation performed and able to accommodate a medium sized Hill-Ferguson retractor. Small amount of residual circumferential tumor, extremely friable. Transanal excision performed.   Pathology:  A. Colonic and squamous mucosa, polyps, transanal resection:   Fragments of tubulovillous adenoma with a background of granulation   tissue.   Margins cannot be assessed due to fragmentation of the specimen.   Negative for residual carcinoma.

## 2022-03-28 ENCOUNTER — Encounter: Admit: 2022-03-28 | Discharge: 2022-03-28 | Payer: MEDICARE

## 2022-03-28 DIAGNOSIS — D369 Benign neoplasm, unspecified site: Secondary | ICD-10-CM

## 2022-03-28 DIAGNOSIS — M549 Dorsalgia, unspecified: Secondary | ICD-10-CM

## 2022-03-28 DIAGNOSIS — M1990 Unspecified osteoarthritis, unspecified site: Secondary | ICD-10-CM

## 2022-03-28 DIAGNOSIS — C189 Malignant neoplasm of colon, unspecified: Secondary | ICD-10-CM

## 2022-03-28 DIAGNOSIS — C20 Malignant neoplasm of rectum: Secondary | ICD-10-CM

## 2022-03-28 NOTE — Patient Instructions
Thank you for visiting me today. Your time is important and if you had to wait at all today, I apologize. My goal is to run exactly on time; however, on occasion, I get behind in clinic due to unexpected patient issues or space constraints. Please use the nurses line below if you have any questions or concerns at any time. Leave a message if necessary and your call will be returned. Feel free to send [place] a MyChart message as well. Messages are reviewed Monday through Friday 8am to 4pm. If it is after 4pm, it may be returned the following business day. Our office is closed on weekends and major holidays. If you have an urgent need after hours please call [phone] and ask for the surgical oncology resident on call to be paged.     If testing was ordered today please know it will automatically be released to your mychart. You may review this before I do. I will send you a message with this result once I have reviewed this.     Please call my nurse listed below for questions or concerns.  Paediatric nurse (Dr. Kathee Delton and Deberah Pelton APRN) - ph. [phone] fax. [phone]     Brunilda Payor. Fredricka Bonine MSN, APRN, AGCNS-BC, AGPCNP-BC  Advanced Practice Provider  Colon and Rectal Surgery  Surgical Oncology  APP for Dr. Kathee Delton, Dr. Daphine Deutscher, and Dr. Lowell Bouton Dilators    You can purchases Hegar Dilators from [employer] by searching the description:   8 PIECE DILATOR SET WITH POUCH - HEGAR SOUNDS.    Please bring these dilators with you to you next appointment so the doctor may provide you in person instruction on how to use them.    At Home Instructions:    Dilate the anal canal or ostomy daily up to an index size of a finger using the Hegar dilators you purchased.   Be sure to use lots of lubrication.   Dilator just needs to be inserted then it can be removed. (No necessary length of time to keep in place.)  The dilator does not need to be inserted very deep, an inch should be plenty, as long as the widest part of the dilator is in your anal canal or ostomy.

## 2022-03-28 NOTE — Progress Notes
Name: Terri Eaton          MRN: [id number]      DOB: [birth year]      AGE: 56 y.o.   DATE OF SERVICE: 03/28/2022    Subjective:             Reason for Visit:  Post Operative Visit and Cancer      Terri Eaton is a 56 y.o. female.      Cancer Staging   No matching staging information was found for the patient.    History of Present Illness  [name] is a 10F with a history of rectal cancer just above the dentate line that has been previously trans-anally excised with residual adenomatous tumor. She underwent a repeat transanal excision on 08/20/2021. 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 It was removed in pieces and cautery limited margin evaluation. This was transanally excised again on 11/11/2021. 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 She was also diagnosed with an anal stricture at that time.     She returns to clinic today for follow up. She is doing well. She is taking Miralax and colace every day. This has been what she has been on. Stools are moving better. Hasn't felt like things gotten worse. Control is best when things are more formed. Feels that she can empty all the way. They are flat and skinny stools.    03/11/2022 - 1. Colonoscopy with cold forceps polypectomy times four 2. Dilation of anal canal.  Findings:  We performed a digital rectal exam, which revealed some narrowing around the anal canal that I was able to dilate with digital exam.  Ultimately, I used the dilators of large size to dilate this area.  We then introduced the Olympus variable stiffness high-definition colonoscope transanally in slow and sequential manner advanced to the cecum.  The cecum was identified by the ileocecal valve, which we could see actively bubbling.  It should be noted her prep was poor.  There was formed stool scattered throughout the starting at the proximal descending colon, across the transverse and then the ascending colon.  We were able to visualize approximately 70% of the mucosal surfaces with irrigation, however, it was not complete.  We used pulse irrigation for visualization as much as we could.   Pathology:  A. Colonic mucosa, rectal polyp, biopsy:   Hyperplastic polyp. ?     B. Colonic mucosa, rectal polyp #2, biopsy:   Hyperplastic polyp. ?     C. Colonic mucosa, rectal polyp #3, biopsy:   Sessile serrated polyp/adenoma. ?   ?   D. Colonic mucosa, rectal polyp #4, biopsy:   Hyperplastic polyp. ?     11/11/2021 - 1. Transanal excision of rectal tumor 2. Dilation of anal stricture  Findings:  Stenosed anus, initially unable to accommodate index finger. Dilation performed and able to accommodate a medium sized Hill-Ferguson retractor. Small amount of residual circumferential tumor, extremely friable. Transanal excision performed.   Pathology:  A. Colonic and squamous mucosa, polyps, transanal resection:   Fragments of tubulovillous adenoma with a background of granulation   tissue.   Margins cannot be assessed due to fragmentation of the specimen.   Negative for residual carcinoma.     Medical History:   Diagnosis Date   ? Arthritis    ? Back pain    ? Cancer of colon [facility])      Surgical History:   Procedure Laterality Date   ? TRANSANAL EXCISION  RECTAL TUMOR - FULL THICKNESS N/A 08/20/2021    Performed by Costella Hatcher, DO at [facility]   ? TRANSANAL EXCISION RECTAL TUMOR - FULL THICKNESS N/A 11/11/2021    Performed by Costella Hatcher, DO at [facility]   ? COLONOSCOPY DIAGNOSTIC WITH SPECIMEN COLLECTION BY BRUSHING/ WASHING - FLEXIBLE N/A 03/11/2022    Performed by Costella Hatcher, DO at [facility] OR   ? ANOSCOPY WITH DILATION N/A 03/11/2022    Performed by Costella Hatcher, DO at [facility]   ? HX CARPAL TUNNEL RELEASE Bilateral    ? KNEE REPLACEMENT Right      No family history on file.  Social History     Socioeconomic History   ? Marital status: Married   Tobacco Use   ? Smoking status: Never   ? Smokeless tobacco: Never   Vaping Use   ? Vaping Use: Never used   Substance and Sexual Activity   ? Alcohol use: Yes     Alcohol/week: 1.0 standard drink of alcohol     Types: 1 Cans of beer per week   ? Drug use: Never     Vaping/E-liquid Use   ? Vaping Use Never User                      Review of Systems   Constitutional: Positive for fatigue. Negative for chills, fever and unexpected weight change.   HENT: Negative.    Eyes: Positive for photophobia.   Respiratory: Negative.    Cardiovascular: Negative.    Gastrointestinal: Positive for constipation. Negative for abdominal distention, abdominal pain, anal bleeding, blood in stool, diarrhea, nausea, rectal pain and vomiting.   Endocrine: Negative.    Genitourinary: Negative.    Musculoskeletal: Positive for arthralgias, back pain and neck stiffness.   Skin: Negative.  Negative for color change and wound.   Allergic/Immunologic: Negative.    Neurological: Negative.  Negative for weakness and light-headedness.   Hematological: Negative.    Psychiatric/Behavioral: Negative.    All other systems reviewed and are negative.        Objective:         ? ALBUTEROL IN Inhale  by mouth into the lungs as Needed.   ? benzonatate (TESSALON) 200 mg capsule Take one capsule by mouth every 8 hours.   ? diphenhydrAMINE hcl (BENADRYL ALLERGY) 25 mg tablet Benadryl Allergy   ? fexofenadine (ALLEGRA) 180 mg tablet Take one tablet by mouth at bedtime daily.   ? gabapentin (NEURONTIN) 300 mg capsule Take two capsules by mouth at bedtime daily.   ? HYDROcodone/acetaminophen (NORCO) 5/325 mg tablet Take one tablet by mouth every 6 hours as needed for Pain.   ? losartan (COZAAR) 50 mg tablet TAKE ONE (1) TABLET BY MOUTH ONCE DAILY for 90   ? meloxicam (MOBIC) 7.5 mg tablet Take one tablet by mouth twice daily.   ? pantoprazole DR (PROTONIX) 40 mg tablet TAKE ONE (1) TABLET BY MOUTH ONCE DAILY for 90   ? polyethylene glycol 3350 (MIRALAX) 17 g packet Take one packet by mouth daily.   ? polyethylene glycol 3350 (MIRALAX) 17 g packet Take one packet by mouth daily. ? promethazine (PHENERGAN) 25 mg tablet Take one tablet by mouth every 6 hours as needed.   ? sertraline (ZOLOFT) 100 mg tablet Take one tablet by mouth daily.   ? tiZANidine (ZANAFLEX) 4 mg capsule Take one capsule by mouth at bedtime daily.     Vitals:  03/28/22 1129   Pulse: 66   Temp: 36.6 ?C (97.9 ?F)   Resp: 16   SpO2: 100%   PainSc: Zero   Weight: 112.5 kg (248 lb)     Body mass index is 43.93 kg/m?Marland Kitchen     Pain Score: Zero             Physical Exam  Vitals reviewed.   Constitutional:       General: She is not in acute distress.     Appearance: Normal appearance. She is well-developed.   HENT:      Head: Normocephalic and atraumatic.      Nose: Nose normal.   Eyes:      General: Lids are normal.      Conjunctiva/sclera: Conjunctivae normal.   Pulmonary:      Effort: Pulmonary effort is normal. No respiratory distress.   Musculoskeletal:         General: Normal range of motion.      Cervical back: Normal range of motion.   Neurological:      Mental Status: She is alert and oriented to person, place, and time.   Psychiatric:         Speech: Speech normal.         Behavior: Behavior normal.         Thought Content: Thought content normal.         Judgment: Judgment normal.               Assessment and Plan:  6F with a history of rectal cancer just above the dentate line s/p three transanal excisions, most recent April 2023. Now with anal stricture post colonoscopy and dilation.     Anal Stricture:  - She was given a handout on anal dilation. We discussed this in clinic today.   - She will self monitor if she finds herself straining more or requiring more stool softeners/laxatives then she will let me know. We would then proceed with anal dilation teaching.     Rectal Cancer s/p Transanal Excision:  - Reviewed procedure and pathology with patient.   - Will need repeat in 60yr due to poor prep and TVA finding.   - Will ask Dr. Kathee Delton if there is a relation to prior tumor from the biopsy that returned TVA. There may not be as the lesion was circumferential.   Per NCCN Guidelines:  - Proctoscopy (EUS or MRI) every 3-47mths for the first 69yrs. Then every for a total of 45yrs.   - Surveillance colonoscopy at 72yr from surgery, repeat in 26yrs, and then every 52yrs unless advanced adenoma detected (villous polyp, polyp >1cm, or high grade dysplasia). If advanced adenoma detected repeat in 62yr.    Plan:  - RTC via telehealth     Total Time Today was 20 minutes in the following activities: Preparing to see the patient, Counseling and educating the patient/family/caregiver and Documenting clinical information in the electronic or other health record    .The patient and family were allowed to ask questions and voice concerns; these were addressed to the best of our ability. They expressed understanding of what was explained to them, and they agreed with the present plan. Patient has the phone numbers for the Cancer Center and was instructed on how to contact us with any questions or concerns.    My collaborating provider on this patient is Dr. Jonny Ruiz Ashcraft DO.    Brunilda Payor. Fredricka Bonine MSN, APRN, AGCNS-BC, AGPCNP-BC  Advanced Practice  Provider  Colon and Rectal Surgery  Surgical Oncology  APP for Dr. Kathee Delton, Dr. Daphine Deutscher and Dr. Daiva Nakayama  Pager: [phone]  Available via Amie Critchley and [facility]

## 2022-04-11 ENCOUNTER — Encounter: Admit: 2022-04-11 | Discharge: 2022-04-11 | Payer: MEDICARE

## 2022-06-23 NOTE — Progress Notes
..  This prechart is intended to be a reference for patient appointments. Information is gathered from in chart as well as external records review. Information will be clarified/verified/updated in final documentation in the office visit.     Pre Clinic Pre Chart:    Terri Eaton is a 65F with a history of rectal cancer just above the dentate line that has been previously trans-anally excised with residual adenomatous tumor. She underwent a repeat transanal excision on 08/20/2021. 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 It was removed in pieces and cautery limited margin evaluation. This was transanally excised again on 11/11/2021. 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 She was also diagnosed with an anal stricture at that time. We discussed anal dilation at her follow up.    Problem List:          03/11/2022 - 1. Colonoscopy with cold forceps polypectomy times four 2. Dilation of anal canal.  Findings:  We performed a digital rectal exam, which revealed some narrowing around the anal canal that I was able to dilate with digital exam.  Ultimately, I used the dilators of large size to dilate this area.  We then introduced the Olympus variable stiffness high-definition colonoscope transanally in slow and sequential manner advanced to the cecum.  The cecum was identified by the ileocecal valve, which we could see actively bubbling.  It should be noted her prep was poor.  There was formed stool scattered throughout the starting at the proximal descending colon, across the transverse and then the ascending colon.  We were able to visualize approximately 70% of the mucosal surfaces with irrigation, however, it was not complete.  We used pulse irrigation for visualization as much as we could.   Pathology:  A. Colonic mucosa, rectal polyp, biopsy:   Hyperplastic polyp. ?     B. Colonic mucosa, rectal polyp #2, biopsy:   Hyperplastic polyp. ?     C. Colonic mucosa, rectal polyp #3, biopsy:   Sessile serrated polyp/adenoma. ?   ?   D. Colonic mucosa, rectal polyp #4, biopsy:   Hyperplastic polyp. ?     11/11/2021 - 1. Transanal excision of rectal tumor 2. Dilation of anal stricture  Findings:  Stenosed anus, initially unable to accommodate index finger. Dilation performed and able to accommodate a medium sized Hill-Ferguson retractor. Small amount of residual circumferential tumor, extremely friable. Transanal excision performed.   Pathology:  A. Colonic and squamous mucosa, polyps, transanal resection:   Fragments of tubulovillous adenoma with a background of granulation   tissue.   Margins cannot be assessed due to fragmentation of the specimen.   Negative for residual carcinoma.

## 2022-06-27 ENCOUNTER — Encounter: Admit: 2022-06-27 | Discharge: 2022-06-27 | Payer: MEDICARE

## 2022-06-27 ENCOUNTER — Ambulatory Visit: Admit: 2022-06-27 | Discharge: 2022-06-27 | Payer: MEDICARE

## 2022-06-27 DIAGNOSIS — M1990 Unspecified osteoarthritis, unspecified site: Secondary | ICD-10-CM

## 2022-06-27 DIAGNOSIS — K624 Stenosis of anus and rectum: Secondary | ICD-10-CM

## 2022-06-27 DIAGNOSIS — D369 Benign neoplasm, unspecified site: Secondary | ICD-10-CM

## 2022-06-27 DIAGNOSIS — M549 Dorsalgia, unspecified: Secondary | ICD-10-CM

## 2022-06-27 DIAGNOSIS — C20 Malignant neoplasm of rectum: Secondary | ICD-10-CM

## 2022-06-27 DIAGNOSIS — C189 Malignant neoplasm of colon, unspecified: Secondary | ICD-10-CM

## 2022-06-27 MED ORDER — SODIUM CHLORIDE 0.9 % IV SOLP
250 mL | INTRAVENOUS | 0 refills
Start: 2022-06-27 — End: ?

## 2022-06-27 NOTE — Progress Notes
Telehealth Visit Note    Date of Service: 06/27/2022    Subjective:           Terri Eaton is a 56 y.o. female.    History of Present Illness  Terri Eaton is a 86F with a history of rectal cancer just above the dentate line that has been previously trans-anally excised with residual adenomatous tumor. She underwent a repeat transanal excision on 08/20/2021. 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 It was removed in pieces and cautery limited margin evaluation. This was transanally excised again on 11/11/2021. 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 She was also diagnosed with an anal stricture at that time. We discussed anal dilation at her follow up.    She returns today for follow up. She has been doing ok. She is having to strain a lot to get stool out. She is taking 3 stool softeners and Miralax daily. She has a bowel movement daily to every other day. Not sure if things have slower gotten worse since last dilation in August. Did find the dilators to order but hasn't ordered them yet. She is seeing some bleeding when she really has to push.     03/11/2022 - 1. Colonoscopy with cold forceps polypectomy times four 2. Dilation of anal canal.  Findings:  We performed a digital rectal exam, which revealed some narrowing around the anal canal that I was able to dilate with digital exam.  Ultimately, I used the dilators of large size to dilate this area.  We then introduced the Olympus variable stiffness high-definition colonoscope transanally in slow and sequential manner advanced to the cecum.  The cecum was identified by the ileocecal valve, which we could see actively bubbling.  It should be noted her prep was poor.  There was formed stool scattered throughout the starting at the proximal descending colon, across the transverse and then the ascending colon.  We were able to visualize approximately 70% of the mucosal surfaces with irrigation, however, it was not complete.  We used pulse irrigation for visualization as much as we could.   Pathology:  A. Colonic mucosa, rectal polyp, biopsy:   Hyperplastic polyp. ?     B. Colonic mucosa, rectal polyp #2, biopsy:   Hyperplastic polyp. ?     C. Colonic mucosa, rectal polyp #3, biopsy:   Sessile serrated polyp/adenoma. ?   ?   D. Colonic mucosa, rectal polyp #4, biopsy:   Hyperplastic polyp. ?     11/11/2021 - 1. Transanal excision of rectal tumor 2. Dilation of anal stricture  Findings:  Stenosed anus, initially unable to accommodate index finger. Dilation performed and able to accommodate a medium sized Hill-Ferguson retractor. Small amount of residual circumferential tumor, extremely friable. Transanal excision performed.   Pathology:  A. Colonic and squamous mucosa, polyps, transanal resection:   Fragments of tubulovillous adenoma with a background of granulation   tissue.   Margins cannot be assessed due to fragmentation of the specimen.   Negative for residual carcinoma.     Medical History:   Diagnosis Date   ? Arthritis    ? Back pain    ? Cancer of colon [facility])      Surgical History:   Procedure Laterality Date   ? TRANSANAL EXCISION RECTAL TUMOR - FULL THICKNESS N/A 08/20/2021    Performed by Costella Hatcher, DO at [facility] OR   ? TRANSANAL EXCISION RECTAL TUMOR - FULL THICKNESS N/A 11/11/2021    Performed by  Ashcraft, Humberto Leep, DO at [facility] OR   ? COLONOSCOPY DIAGNOSTIC WITH SPECIMEN COLLECTION BY BRUSHING/ WASHING - FLEXIBLE N/A 03/11/2022    Performed by Costella Hatcher, DO at [facility] OR   ? ANOSCOPY WITH DILATION N/A 03/11/2022    Performed by Costella Hatcher, DO at [facility] OR   ? HX CARPAL TUNNEL RELEASE Bilateral    ? KNEE REPLACEMENT Right      No family history on file.  Social History     Socioeconomic History   ? Marital status: Married   Tobacco Use   ? Smoking status: Never   ? Smokeless tobacco: Never   Vaping Use   ? Vaping Use: Never used   Substance and Sexual Activity   ? Alcohol use: Yes     Alcohol/week: 1.0 standard drink of alcohol     Types: 1 Cans of beer per week   ? Drug use: Never     Vaping/E-liquid Use   ? Vaping Use Never User                      Review of Systems   Constitutional: Negative.  Negative for chills, fever and unexpected weight change.   HENT: Negative.    Eyes: Negative.    Respiratory: Negative.    Cardiovascular: Negative.    Gastrointestinal: Positive for anal bleeding and constipation. Negative for abdominal distention, abdominal pain, blood in stool, diarrhea, nausea, rectal pain and vomiting.   Endocrine: Negative.    Genitourinary: Negative.    Musculoskeletal: Negative.    Skin: Negative.  Negative for color change and wound.   Allergic/Immunologic: Negative.    Neurological: Negative.  Negative for weakness and light-headedness.   Hematological: Negative.    Psychiatric/Behavioral: Negative.    All other systems reviewed and are negative.      .  Objective:         ? ALBUTEROL IN Inhale  by mouth into the lungs as Needed.   ? benzonatate (TESSALON) 200 mg capsule Take one capsule by mouth every 8 hours.   ? diphenhydrAMINE hcl (BENADRYL ALLERGY) 25 mg tablet Benadryl Allergy   ? fexofenadine (ALLEGRA) 180 mg tablet Take one tablet by mouth at bedtime daily.   ? gabapentin (NEURONTIN) 300 mg capsule Take two capsules by mouth at bedtime daily.   ? HYDROcodone/acetaminophen (NORCO) 5/325 mg tablet Take one tablet by mouth every 6 hours as needed for Pain.   ? losartan (COZAAR) 50 mg tablet TAKE ONE (1) TABLET BY MOUTH ONCE DAILY for 90   ? meloxicam (MOBIC) 7.5 mg tablet Take one tablet by mouth twice daily.   ? pantoprazole DR (PROTONIX) 40 mg tablet TAKE ONE (1) TABLET BY MOUTH ONCE DAILY for 90   ? polyethylene glycol 3350 (MIRALAX) 17 g packet Take one packet by mouth daily.   ? polyethylene glycol 3350 (MIRALAX) 17 g packet Take one packet by mouth daily.   ? promethazine (PHENERGAN) 25 mg tablet Take one tablet by mouth every 6 hours as needed.   ? sertraline (ZOLOFT) 100 mg tablet Take one tablet by mouth daily.   ? tiZANidine (ZANAFLEX) 4 mg capsule Take one capsule by mouth at bedtime daily.          Telehealth Patient Reported Vitals     Row Name 06/27/22 1040                Pain Score Zero  Computed Telehealth Body Mass Index unavailable. Necessary lab results were not found in the last year.    Physical Exam  Vitals reviewed.   Constitutional:       General: She is not in acute distress.     Appearance: Normal appearance. She is well-developed.   HENT:      Head: Normocephalic and atraumatic.      Nose: Nose normal.   Eyes:      General: Lids are normal.      Conjunctiva/sclera: Conjunctivae normal.   Pulmonary:      Effort: Pulmonary effort is normal. No respiratory distress.   Musculoskeletal:         General: Normal range of motion.      Cervical back: Normal range of motion.   Neurological:      Mental Status: She is alert and oriented to person, place, and time.   Psychiatric:         Speech: Speech normal.         Behavior: Behavior normal.         Thought Content: Thought content normal.         Judgment: Judgment normal.              Assessment and Plan:  22F with a history of rectal cancer just above the dentate line that has been previously trans-anally excised with residual adenomatous tumor. She underwent a repeat transanal excision on 08/20/2021 and again on 11/11/2021. She now struggles with anal stricture. Bowels requiring 3 stool softeners and Miralax everyday.    Anal Stricture:  - Discussed planning for repeat EUA and dilation in approximately February as seems things are slowly getting worse.   - Our plan is to start anal dilation to maintain after the procedure. She will go ahead and order the dilators so they are ready.     Rectal Cancer s/p Transanal Excision:  - Repeat colonoscopy in August 2023 due to prep.   - Last MRI was October 2022. We should repeat this. The rectal cancer was was removed in April 2022. We should repeat this MRI. Ordered and will arrange at [facility]. Thelma Barge.   Per NCCN Guidelines:  - Proctoscopy (EUS or MRI) every 3-42mths for the first 57yrs. Then every for a total of 31yrs.   - Surveillance colonoscopy at 22yr from surgery, repeat in 53yrs, and then every 41yrs unless advanced adenoma detected (villous polyp, polyp >1cm, or high grade dysplasia). If advanced adenoma detected repeat in 55yr.    Plan:  - RTC post operatively.     .The patient and family were allowed to ask questions and voice concerns; these were addressed to the best of our ability. They expressed understanding of what was explained to them, and they agreed with the present plan. Patient has the phone numbers for the Cancer Center and was instructed on how to contact us with any questions or concerns.    My collaborating provider on this patient is Dr. Jonny Ruiz Ashcraft DO.    Brunilda Payor. Fredricka Bonine MSN, APRN, AGCNS-BC, AGPCNP-BC  Advanced Practice Provider  Colon and Rectal Surgery  Surgical Oncology  APP for Dr. Kathee Delton, Dr. Daphine Deutscher and Dr. Daiva Nakayama  Pager: [phone]  Available via Amie Critchley and [facility] Connect                           15 minutes spent on this patient's encounter with counseling and coordination of care taking >50% of  the visit.

## 2022-06-27 NOTE — Patient Instructions
[facility]  Surgery Instructions    Surgery Date: September 17, 2022  (Please be aware that this appointment for your surgery will not be visible in the MyChart portal.)    Location:  Your surgery will be held at [facility] ([address] [place], [place] [address].)  Where to park on the day of surgery: [address]age (located just [place] [address] on [address])  Where to check in on the day of surgery: In the Admitting office (located on Level 1 of hospital of the [employer] [facility])      Arrival Time:   The OR scheduling office must finalize the OR schedule before we can provide a time for your surgery. Please do not call your physicians office about surgery arrival time. Your surgery start time and arrive times will be called out to you the day before your procedure between 2pm-4:30pm. If you have not heard from the hospital regarding your surgery time by 4:30PM the evening before your surgery, please call [phone].  The day of the procedure, if you cannot keep your appointment, or are delayed, please contact the surgery center immediately at [phone].      Illness:  If you develop symptoms of illness within 14 days of your procedure, please call your provider's office (nurse contact can be found at the bottom of this education).  Symptoms of concerns can included: Fever or chills, Cough, Shortness of breath or difficulty breathing, Fatigue, Muscle or body aches, Headache, New loss of taste or smell, Sore throat, Congestion or runny nose, Nausea or vomiting, Diarrhea.    Eating and drinking:   Do not eat after 11PM the night before your surgery unless otherwise instructed. No gum, No candy or mints! Sips or small amounts of water and clear liquids may be consumed after 11pm, but must be stopped 2 hours before you need to arrive to the hospital the day of your surgery.     Bowel Prep:   No Bowel Prep Required     Hygiene: Shower the night before and the morning of your procedure , you will not need to perform your showers with any special soaps, just be sure you clean on the day of your surgery.    Medications:    You will be given medication instructions by the Pre-Operative Assessment Clinic [facility]). If you have NOT been scheduled a Pre-Operative Assessment appointment, you will receive a phone call to be ?triaged? by the anesthesia team. Please be sure to speak with the anesthesia team or attend your [facility] appointment if setup for you. If you do not present to your scheduled appointment or complete the triage phone call, this may result in cancellation or delay of your surgery.    Please contact the pre-anesthesia pharmacist by phone number [phone] , or by e-mail ([email or web address]) for the following reasons:   If you did not receive medication instructions and it is less than 2 weeks prior to your surgery date  You have questions about any of your current medications instructions previously provided by the [facility] team  You have any changes to your medication list after you attended your appointment or [facility] triage phone call    Personal Items:    Do not wear makeup, fingernail polish, lotion, deodorant, or perfume  Remove all metal jewelry and piercings.   Bring a storage container for your glasses, contacts, hearing aids, dentures, etc.  Thing to Bring:    Personal identification (ID) / Surveyor, quantity card(s)  Official documents for legal guardianship  Copy of your Living Will, Advanced Directives, and/or Durable Power of [employer]. If you have these documents, please bring them to the admissions office on the day of your surgery to be scanned into your records.  CPAP/BiPAP machine (including all supplies)  Walker, cane, or motorized scooter  Cases for glasses/hearing aids/contact lens (bring solutions for contacts)  Stimulator remote  Do not bring medications from home unless instructed by a pharmacist.    Transportation Needs:    Please be aware of the following:   You are having an outpatient surgery, a responsible adult you know and trust is required to drive you home and stay with you for 12-24 hours after surgery. If you do not have this arranged prior to surgery, your surgery may be delayed or cancelled. An Benedetto Goad, taxi or other public transportation driver is not considered a responsible person to accompany you home.    FMLA:   If you have any FMLA or Short Term Disability Paperwork needs, you may have these faxed to [phone]- Attention to Marni Griffon, RN. Be sure your name and birthday are on the forms, along with a return fax number for this office to return them to once completed. Please allow up to 7-10 business days for completion of paperwork.     Billing Questions:   A member of the business office will verify your medical benefits with your insurance company prior to your surgery. If you have questions about understanding the cost of your care please call the Financial Customer Service at [phone].     Current Visitor Policies:  A maximum of 2 total visitors are allowed during your hospital admission each day.   During the time of your surgery, due to the size of the perioperative waiting room, only 1 visitor is allowed in the designated waiting room.  If you have 2 visitors during the time of your surgery, please know your visitors will be asked to find a place to wait in the hospital lobby or cafeteria and will be notified by the nurse liaison team, when you are out of surgery.  No visitors are allowed in pre/post patient care areas until a patient is transferred to a patient room.    Any visitors who have a fever or other cold- or flu-like symptoms will not be allowed in [facility] facilities.   We encourage all visitors to wash their hands or use hand sanitizer when entering or exiting patient rooms. Mask wearing, although not required at this time, is still highly encouraged.   Children younger than age 81 are allowed to visit inpatients.   Additional guidelines may vary, based on patient care area or patient's condition.    Thank you for understanding our visitation policies. We will continue to evaluate our recommendations and keep you updated on revisions based on public health guidance.        If you have questions, please call [phone]. Ask to be connected to [employer] (Nurse for Dr. Kathee Delton).     If your needs are URGENT and it?s after hours   please call [phone] and have the on call provider with Surgical Oncology paged.

## 2022-06-27 NOTE — Progress Notes
Order for MRI Pelvis on 3T faxed to [facility] radiology scheduling (fax: [phone].) Patient notified via mychart that [facility] should contact her to schedule this and to let me know when it is scheduled.  No Auth needed per insurance.

## 2022-06-27 NOTE — Progress Notes
[facility] NEEDED FOR EXTERNAL IMAGING    Patient info:  Name: Terri Eaton    MRN: [id number]          DOB: 1966/01/27      Insurance:   Payor: UHC MEDICARE / Plan: UHC MEDICARE REPLACEMENT [id number] / Product Type: Medicare /      Order sent to Facility :  Will send when authorization complete    What Facility was the order sent to (if multiple locations please specify address):   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

## 2022-07-01 ENCOUNTER — Encounter: Admit: 2022-07-01 | Discharge: 2022-07-01 | Payer: MEDICARE

## 2022-07-09 ENCOUNTER — Encounter: Admit: 2022-07-09 | Discharge: 2022-07-09 | Payer: MEDICARE

## 2022-07-23 ENCOUNTER — Encounter: Admit: 2022-07-23 | Discharge: 2022-07-23 | Payer: MEDICARE

## 2022-07-23 NOTE — Progress Notes
Confirmed with Aldona Lento in [place] that patient is scheduled for MRI Pelvis on 3T on 1/15 at 7:40am.

## 2022-08-12 ENCOUNTER — Encounter: Admit: 2022-08-12 | Discharge: 2022-08-12 | Payer: MEDICARE

## 2022-08-12 NOTE — Progress Notes
Terri Eaton was scheduled yesterday 1/15 at [facility] for MRI Pelvis on 3T.  Nothing in Care Everywhere of the report, so I contacted radiology scheduling.  They reported that patient cancelled and rescheduled to 2/19. Will follow up at that time.

## 2022-08-18 ENCOUNTER — Encounter: Admit: 2022-08-18 | Discharge: 2022-08-18 | Payer: MEDICARE

## 2022-08-18 DIAGNOSIS — M1990 Unspecified osteoarthritis, unspecified site: Secondary | ICD-10-CM

## 2022-08-18 DIAGNOSIS — M549 Dorsalgia, unspecified: Secondary | ICD-10-CM

## 2022-08-18 DIAGNOSIS — C189 Malignant neoplasm of colon, unspecified: Secondary | ICD-10-CM

## 2022-09-15 ENCOUNTER — Encounter: Admit: 2022-09-15 | Discharge: 2022-09-15 | Payer: No Typology Code available for payment source

## 2022-09-16 ENCOUNTER — Encounter: Admit: 2022-09-16 | Discharge: 2022-09-16 | Payer: No Typology Code available for payment source

## 2022-09-16 NOTE — Telephone Encounter
Patient was scheduled for MRI Pelvis on 2/19 at [facility] in [place].  Called radiology schedule as no results available.  They state patient cancelled appointment and MRI is now moved to 3/20 at 1pm.  Will follow up at that time.

## 2022-09-16 NOTE — Telephone Encounter
Pt called to report she is currently being treated by her PCP for a UTI and kidney infection.  Currently on antibiotics.  I offered to talk with Dr. Cena Benton if he is ok with proceeding, but pt not wanting to move forward with tomorrow and preferred to reschedule.  Rescheduled surgery to 3/26.

## 2022-09-17 ENCOUNTER — Encounter: Admit: 2022-09-17 | Discharge: 2022-09-17 | Payer: No Typology Code available for payment source

## 2022-09-22 ENCOUNTER — Encounter: Admit: 2022-09-22 | Discharge: 2022-09-22 | Payer: No Typology Code available for payment source

## 2022-09-22 DIAGNOSIS — M1990 Unspecified osteoarthritis, unspecified site: Secondary | ICD-10-CM

## 2022-09-22 DIAGNOSIS — M549 Dorsalgia, unspecified: Secondary | ICD-10-CM

## 2022-09-22 DIAGNOSIS — C189 Malignant neoplasm of colon, unspecified: Secondary | ICD-10-CM

## 2022-10-14 ENCOUNTER — Encounter: Admit: 2022-10-14 | Discharge: 2022-10-14 | Payer: No Typology Code available for payment source

## 2022-10-14 NOTE — Telephone Encounter
Pt had r/s surgery from Feb to 3/26. Her postop was sch for next week still, I called her to move this out. She said she doesn't have money to come to [facility] next week. I have r/s her procedure with Dr. Cena Benton to 4/16. She said she had to r/s her MRI pelvis at [facility] to 4/15. I will make note of this and request after it is done.

## 2022-11-10 ENCOUNTER — Encounter: Admit: 2022-11-10 | Discharge: 2022-11-10 | Payer: No Typology Code available for payment source

## 2022-11-11 ENCOUNTER — Encounter: Admit: 2022-11-11 | Discharge: 2022-11-11 | Payer: No Typology Code available for payment source

## 2022-11-11 NOTE — Telephone Encounter
Pt called stating she cannot come to her procedure with Dr. Kathee Delton today. She said her husband didn't sleep well and the weather is bad. I talked to Dr. Kathee Delton about this. This is her 3rd reschedule. He does not want to r/s her at this time. I have moved her postop in May with Deberah Pelton to end of June with Dr. Kathee Delton. I told pt he will need to talk to her before we r/s her surgery. She apologized and said she feels bad. I told her this is the 3rd time she is rescheduling, and to cx the day prior or day of does not give [place] time to fill the empty OR time.    She said she did have her MRI pelvis done yesterday at [facility]. Thelma Barge. I will follow up on getting this clouded before her follow up in June.

## 2022-11-11 NOTE — Telephone Encounter
Pt was sch to have an MRI pelvis done yesterday, 4/15 at [facility]. Terri Eaton in [place]. I do not see in Care Everywhere that this was done. I called pt this morning to ask. I had to leave her a VM. I asked her to call me back and let me know if it was done or r/s. I left her my direct phone number.

## 2022-11-12 ENCOUNTER — Encounter: Admit: 2022-11-12 | Discharge: 2022-11-12 | Payer: No Typology Code available for payment source

## 2022-12-16 ENCOUNTER — Encounter: Admit: 2022-12-16 | Discharge: 2022-12-16 | Payer: No Typology Code available for payment source

## 2023-01-16 ENCOUNTER — Encounter: Admit: 2023-01-16 | Discharge: 2023-01-16 | Payer: No Typology Code available for payment source

## 2023-01-16 ENCOUNTER — Ambulatory Visit: Admit: 2023-01-16 | Discharge: 2023-01-16 | Payer: No Typology Code available for payment source

## 2023-01-16 DIAGNOSIS — C20 Malignant neoplasm of rectum: Secondary | ICD-10-CM

## 2023-01-16 DIAGNOSIS — K624 Stenosis of anus and rectum: Secondary | ICD-10-CM

## 2023-01-16 DIAGNOSIS — C189 Malignant neoplasm of colon, unspecified: Secondary | ICD-10-CM

## 2023-01-16 DIAGNOSIS — M1990 Unspecified osteoarthritis, unspecified site: Secondary | ICD-10-CM

## 2023-01-16 DIAGNOSIS — M549 Dorsalgia, unspecified: Secondary | ICD-10-CM

## 2023-01-16 MED ORDER — SODIUM CHLORIDE 0.9 % IV SOLP
250 mL | INTRAVENOUS | 0 refills
Start: 2023-01-16 — End: ?

## 2023-01-16 NOTE — Patient Instructions
[facility]  Surgery Instructions    Surgery Date: 02/17/23  (Please be aware that this appointment for your surgery will not be visible in the MyChart portal.)    Location:  Your surgery will be held at [facility] ([address], [place], [place] [address].)  Where to park on the day of surgery: [address]age (located just [place] [address] on [place])  Where to check in on the day of surgery: In the Admitting office (located on Level 1 of hospital of the [employer] [facility])      Arrival Time:   The OR scheduling office must finalize the OR schedule before we can provide a time for your surgery. Please do not call your physicians office about surgery arrival time. Your surgery start time and arrive times will be called out to you the day before your procedure between 2pm-4:30pm. If you have not heard from the hospital regarding your surgery time by 4:30PM the evening before your surgery, please call [phone].  The day of the procedure, if you cannot keep your appointment, or are delayed, please contact the surgery center immediately at [phone].      Illness:  If you develop symptoms of illness within 14 days of your procedure, please call your provider's office (nurse contact can be found at the bottom of this education).  Symptoms of concerns can included: Fever or chills, Cough, Shortness of breath or difficulty breathing, Fatigue, Muscle or body aches, Headache, New loss of taste or smell, Sore throat, Congestion or runny nose, Nausea or vomiting, Diarrhea.    Eating and drinking:   Do not eat after 11PM the night before your surgery unless otherwise instructed. No gum, No candy or mints! Sips or small amounts of water and clear liquids may be consumed after 11pm, but must be stopped two hours prior to surgery time.     Bowel Prep:   No Bowel Prep Required     Hygiene:    Shower the night before and the morning of your procedure , you will not need to perform your showers with any special soaps, just be sure you clean on the day of your surgery.    Medications:    You will be given medication instructions by the Pre-Operative Assessment Clinic [facility]). If you have NOT been scheduled a Pre-Operative Assessment appointment, you will receive a phone call to be ?triaged? by the anesthesia team. Please be sure to speak with the anesthesia team or attend your [facility] appointment if setup for you. If you do not present to your scheduled appointment or complete the triage phone call, this may result in cancellation or delay of your surgery.    Please contact the pre-anesthesia pharmacist by phone number [phone] , or by e-mail ([email or web address]) for the following reasons:   If you did not receive medication instructions and it is less than 2 weeks prior to your surgery date  You have questions about any of your current medications instructions previously provided by the [facility] team  You have any changes to your medication list after you attended your appointment or [facility] triage phone call    Personal Items:    Do not wear makeup, fingernail polish, lotion, deodorant, or perfume  Remove all metal jewelry and piercings.   Bring a Retail buyer for your glasses, contacts, hearing aids, dentures, etc.     Thing to Bring:    Personal identification (  ID) / Surveyor, quantity card(s)  Official documents for legal guardianship  Copy of your Living Will, Advanced Directives, and/or Durable Power of [employer]. If you have these documents, please bring them to the admissions office on the day of your surgery to be scanned into your records.  CPAP/BiPAP machine (including all supplies)  Walker, cane, or motorized scooter  Cases for glasses/hearing aids/contact lens (bring solutions for contacts)  Stimulator remote  Do not bring medications from home unless instructed by a pharmacist.    Transportation Needs:    Please be aware of the following:   You are having an outpatient surgery, a responsible adult you know and trust is required to drive you home and stay with you for 12-24 hours after surgery. If you do not have this arranged prior to surgery, your surgery may be delayed or cancelled. An [employer] Goad, taxi or other public transportation driver is not considered a responsible person to accompany you home.    FMLA:   If you have any FMLA or Short Term Disability Paperwork needs, you may have these faxed to [phone]- Attention to Marni Griffon, RN. Be sure your name and birthday are on the forms, along with a return fax number for this office to return them to once completed. Please allow up to 7-10 business days for completion of paperwork.     Billing Questions:   A member of the business office will verify your medical benefits with your insurance company prior to your surgery. If you have questions about understanding the cost of your care please call the Financial Customer Service at [phone].     Current Visitor Policies:  A maximum of 2 total visitors are allowed during your hospital admission each day.   During the time of your surgery, due to the size of the perioperative waiting room, only 1 visitor is allowed in the designated waiting room.  If you have 2 visitors during the time of your surgery, please know your visitors will be asked to find a place to wait in the hospital lobby or cafeteria and will be notified by the nurse liaison team, when you are out of surgery.  No visitors are allowed in pre/post patient care areas until a patient is transferred to a patient room.    Any visitors who have a fever or other cold- or flu-like symptoms will not be allowed in [facility] facilities.   We encourage all visitors to wash their hands or use hand sanitizer when entering or exiting patient rooms. Mask wearing, although not required at this time, is still highly encouraged.   Children younger than age 62 are allowed to visit inpatients.   Additional guidelines may vary, based on patient care area or patient's condition.    Thank you for understanding our visitation policies. We will continue to evaluate our recommendations and keep you updated on revisions based on public health guidance.          If you have questions, please call [phone]. Ask to be connected to [employer] (Nurse for Dr. Kathee Delton).     If your needs are URGENT and it?s after hours   please call [phone] and have the on call provider with Surgical Oncology paged.

## 2023-01-16 NOTE — Progress Notes
Name: Terri Eaton          MRN: [id number]      DOB: 29-Jun-1966      AGE: 57 y.o.   DATE OF SERVICE: 01/16/2023    Subjective:             Reason for Visit:  Follow Up      Terri Eaton is a 57 y.o. female.      Cancer Staging   No matching staging information was found for the patient.      History of Present Illness     The patient, a 57 year old individual with a history of rectal cancer, has undergone two transanal excisions on 08-20-21 and 11-11-21. Post-procedure, she developed an anal stricture which has been progressively worsening. The patient reports constipation, requiring Miralax three times daily. She also experiences occasional bleeding and pain during bowel movements, with the most recent episode of bright red blood occurring the day before the consultation. The patient has also noted a weight gain of ten pounds. Despite these challenges, she has had difficulty adhering to scheduled appointments, having cancelled three times previously.         Past Medical History:   Diagnosis Date    Arthritis     Back pain     Cancer of colon [facility])      Surgical History:   Procedure Laterality Date    TRANSANAL EXCISION RECTAL TUMOR - FULL THICKNESS N/A 08/20/2021    Performed by Costella Hatcher, DO at [facility] OR    TRANSANAL EXCISION RECTAL TUMOR - FULL THICKNESS N/A 11/11/2021    Performed by Costella Hatcher, DO at [facility]    COLONOSCOPY DIAGNOSTIC WITH SPECIMEN COLLECTION BY BRUSHING/ WASHING - FLEXIBLE N/A 03/11/2022    Performed by Costella Hatcher, DO at [facility] OR    ANOSCOPY WITH DILATION N/A 03/11/2022    Performed by Costella Hatcher, DO at [facility] OR    HX CARPAL TUNNEL RELEASE Bilateral     KNEE REPLACEMENT Right      No family history on file.  Social History     Socioeconomic History    Marital status: Married   Tobacco Use    Smoking status: Never    Smokeless tobacco: Never   Vaping Use    Vaping status: Never Used   Substance and Sexual Activity    Alcohol use: Yes     Alcohol/week: 1.0 standard drink of alcohol     Types: 1 Cans of beer per week    Drug use: Never     Vaping/E-liquid Use    Vaping Use Never User                      Review of Systems   Constitutional:  Negative for activity change, chills, fatigue and fever.   HENT:  Negative for congestion.    Eyes: Negative.    Respiratory:  Negative for cough, choking, chest tightness and shortness of breath.    Cardiovascular:  Negative for chest pain and leg swelling.   Gastrointestinal:  Negative for abdominal distention, abdominal pain, anal bleeding, blood in stool, constipation, diarrhea, nausea, rectal pain and vomiting.   Endocrine: Negative for polydipsia and polyphagia.   Genitourinary:  Negative for difficulty urinating, dysuria and frequency.   Musculoskeletal:  Negative for arthralgias and myalgias.   Skin:  Negative for pallor and rash.   Neurological:  Negative for dizziness and light-headedness.   Hematological:  Negative for adenopathy. Does not bruise/bleed easily.   Psychiatric/Behavioral:  Negative for agitation, behavioral problems and confusion.          Objective:          ALBUTEROL IN Inhale  by mouth into the lungs as Needed.    atorvastatin (LIPITOR) 10 mg tablet Take one tablet by mouth at bedtime daily.    benzonatate (TESSALON) 200 mg capsule Take one capsule by mouth every 8 hours as needed for Cough.    diphenhydrAMINE hcl (BENADRYL ALLERGY) 25 mg tablet Benadryl Allergy    fexofenadine (ALLEGRA) 180 mg tablet Take one tablet by mouth at bedtime daily.    gabapentin (NEURONTIN) 300 mg capsule Take two capsules by mouth at bedtime daily.    HYDROcodone/acetaminophen (NORCO) 5/325 mg tablet Take one tablet by mouth every 6 hours as needed for Pain.    losartan (COZAAR) 50 mg tablet TAKE ONE (1) TABLET BY MOUTH ONCE DAILY for 90    pantoprazole DR (PROTONIX) 40 mg tablet TAKE ONE (1) TABLET BY MOUTH ONCE DAILY for 90    polyethylene glycol 3350 (MIRALAX) 17 g packet Take one packet by mouth daily.    polyethylene glycol 3350 (MIRALAX) 17 g packet Take one packet by mouth daily.    PREMARIN 0.625 mg/gram vaginal cream     promethazine (PHENERGAN) 25 mg tablet Take one tablet by mouth every 6 hours as needed.    sertraline (ZOLOFT) 100 mg tablet Take one tablet by mouth daily.    tiZANidine (ZANAFLEX) 4 mg capsule Take one capsule by mouth at bedtime daily.     Vitals:    01/16/23 0810   BP: 123/58  Comment: more common headaches   BP Source: Arm, Left Upper   Pulse: 75   Temp: 36 ?C (96.8 ?F)   Resp: 15   SpO2: 93%   TempSrc: Temporal   PainSc: Eight   Weight: 116.5 kg (256 lb 12.8 oz)     Body mass index is 45.49 kg/m?Marland Kitchen     Pain Score: Eight  Pain Loc: Knee (joints (achy))    Fatigue Scale: 6 (fatigued and tired)    Pain Addressed:  N/A    Patient Evaluated for a Clinical Trial: No treatment clinical trial available for this patient.     [place] Cooperative Oncology Group performance status is 0, Fully active, able to carry on all pre-disease performance without restriction.Marland Kitchen     Physical Exam  Vitals reviewed.   Constitutional:       Appearance: She is well-developed.   HENT:      Head: Normocephalic and atraumatic.   Eyes:      General: No scleral icterus.        Right eye: No discharge.         Left eye: No discharge.      Conjunctiva/sclera: Conjunctivae normal.   Neck:      Trachea: No tracheal deviation.   Cardiovascular:      Rate and Rhythm: Normal rate.      Pulses:           Radial pulses are 2+ on the right side and 2+ on the left side.   Pulmonary:      Effort: Pulmonary effort is normal.   Abdominal:      General: Bowel sounds are normal.      Palpations: Abdomen is soft.   Musculoskeletal:         General: Normal range of motion.  Cervical back: Normal range of motion.   Lymphadenopathy:      Cervical: No cervical adenopathy.      Right cervical: No superficial cervical adenopathy.     Left cervical: No superficial cervical adenopathy.      Comments: No significant edema   Skin:     General: Skin is warm and dry. Coloration: Skin is not pale.      Findings: No erythema or rash.   Neurological:      Mental Status: She is alert and oriented to person, place, and time.   Psychiatric:         Behavior: Behavior normal.         Thought Content: Thought content normal.               Assessment and Plan:      Anal Stricture: Progressive worsening of stricture with constipation, occasional bleeding, and pain with bowel movements. History of rectal cancer with multiple transanal excisions. Discussed the need for dilation and the options of doing it in the operating room vs at home with a dilation kit.  -Plan for initial dilation in the operating room due to the severity of symptoms.  -Prescribe home dilation kit for patient to continue dilation at home after initial procedure.  -Emphasized the importance of adherence to the scheduled procedure date due to previous cancellations, we will have to release her is she cancels again.    Constipation: Requires Miralax three times daily due to anal stricture.  -Continue Miralax as needed.    Rectal Bleeding: Reports occasional bleeding with increased effort during bowel movements, most recently with bright red blood.  -Monitor closely, likely related to anal stricture.    Weight Gain: Reports a ten-pound weight gain.  -Monitor closely, no specific intervention at this time.    Follow-up: After dilation procedure, monitor patient's progress with home dilation kit and symptom improvement.

## 2023-01-19 ENCOUNTER — Encounter: Admit: 2023-01-19 | Discharge: 2023-01-19 | Payer: No Typology Code available for payment source

## 2023-01-20 ENCOUNTER — Encounter: Admit: 2023-01-20 | Discharge: 2023-01-20 | Payer: No Typology Code available for payment source

## 2023-01-30 ENCOUNTER — Encounter: Admit: 2023-01-30 | Discharge: 2023-01-30 | Payer: No Typology Code available for payment source

## 2023-01-30 DIAGNOSIS — R911 Solitary pulmonary nodule: Secondary | ICD-10-CM

## 2023-01-30 DIAGNOSIS — M549 Dorsalgia, unspecified: Secondary | ICD-10-CM

## 2023-01-30 DIAGNOSIS — C189 Malignant neoplasm of colon, unspecified: Secondary | ICD-10-CM

## 2023-01-30 DIAGNOSIS — M1990 Unspecified osteoarthritis, unspecified site: Secondary | ICD-10-CM

## 2023-02-02 ENCOUNTER — Encounter: Admit: 2023-02-02 | Discharge: 2023-02-02 | Payer: No Typology Code available for payment source

## 2023-02-04 ENCOUNTER — Encounter: Admit: 2023-02-04 | Discharge: 2023-02-04 | Payer: No Typology Code available for payment source

## 2023-02-04 ENCOUNTER — Ambulatory Visit: Admit: 2023-02-04 | Discharge: 2023-02-05 | Payer: No Typology Code available for payment source

## 2023-02-04 DIAGNOSIS — C189 Malignant neoplasm of colon, unspecified: Secondary | ICD-10-CM

## 2023-02-04 DIAGNOSIS — M549 Dorsalgia, unspecified: Secondary | ICD-10-CM

## 2023-02-04 DIAGNOSIS — R911 Solitary pulmonary nodule: Secondary | ICD-10-CM

## 2023-02-04 DIAGNOSIS — C801 Malignant (primary) neoplasm, unspecified: Secondary | ICD-10-CM

## 2023-02-04 DIAGNOSIS — M1990 Unspecified osteoarthritis, unspecified site: Secondary | ICD-10-CM

## 2023-02-04 NOTE — Pre-Anesthesia Patient Instructions
PREPROCEDURE INFORMATION    Arrival at the hospital  [facility]  [address]  [place], [place] [address]    Park in the P5 parking garage located at [address] [place], [place] [address].   If parking in the P5 garage, take the east elevators in the parking garage to the second level and walk to the entrance of the [employer].    Enter through the 1st floor main entrance and check in with Information Desk.   If you are a woman between the ages of 34 and 9, and have not had a hysterectomy, you will be asked for a urine sample prior to surgery.  Please do not urinate before arriving in the Surgery Waiting Room.  Once there, check in and let the attendant know if you need to provide a sample.    You will receive a call with your surgery arrival time between 2:30pm and 4:30pm the last business day before your procedure.  If you do not receive a call, please call [phone] before 4:30pm or [phone] after 4:30pm.  Phone carriers that use spam blockers will sometimes block our phone numbers. If your phone contact number is a mobile phone, please adjust your settings to make sure you receive our call.  In your phone settings, turn OFF the setting ?silence unknown callers.?  Please add these phone numbers to your contacts [phone] & [phone]  Follow any additional instructions from Dr Ashcraft's office    Eating or drinking before surgery    Nothing to eat after 11:00pm the night before your surgery including gum, mints and candy. You may have clear liquids up to 2 hours before your surgery time.     If you have received specific instructions from your surgeon, please follow those.     Clear Liquid Examples:   Water  Clear juice - Apple or cranberry (no pulp or orange juice) - If diabetic blood sugar must be <200  Coffee and tea with or without sugar (no cream)   Sports drinks - Powerade/Gatorade   Soda   Bowel Prep solutions only if ordered by your surgeon     Planning transportation for outpatient procedure  For your safety, you will need to arrange for a responsible ride/person to accompany you home due to sedation or anesthesia with your procedure.  An [employer] Goad, taxi or other public transportation driver is not considered a responsible person to accompany you home.    Bath/Shower Instructions  Take a bath or shower with antibacterial soap the night before and the morning of your procedure. Use clean towels.  Put on clean clothes after bath or shower.  Avoid using lotion and oils.  Sleep on clean sheets if bath or shower is done the night before procedure.    Morning of your procedure:  Brush your teeth and tongue  Do not smoke, vape, chew or user any tobacco products.  Do not shave the area where you will have surgery.  Remove nail polish, makeup and all jewelry (including piercings) before coming to the hospital.  Dress in clean, loose, comfortable clothing.    Valuables  Leave money, credit cards, jewelry, and any other valuables at home. If medications are being filled and picked up at a [place] pharmacy, payment will be needed. The [facility] is not responsible for the loss or breakage of personal items.    What to bring to the hospital  ID/Insurance card  Medical Device card  Official documents for legal guardianship  Copy of your Living Will, Advanced Directives, and/or Durable Power of Attorney. If you have these documents, please bring them to the admissions office on the day of your surgery to be scanned into your records.  Do not bring medications from home unless instructed by a pharmacist.  CPAP/BiPAP machine (including all supplies)  Walker, cane, or motorized scooter  Cases for glasses/hearing aids/contact lens (bring solutions for contacts)  Stimulator remote  Insulin pump and supplies as directed by pharmacy     Notify us at [facility]: [phone] on the day of your procedure if:  You need to cancel your procedure.  You are going to be late.    Notify your surgeon if:  There is a possibility that you are pregnant.  You become ill with a cough, fever, sore throat, nausea, vomiting or flu-like symptoms.  You have any open wounds/sores that are red, painful, draining, or are new since you last saw the doctor.  You need to cancel your procedure.    Preparing to get your medications at discharge  Your surgeon may prescribe you medications to take after your procedure.  If you like the convenience of having your medications filled here at [facility], please do the following:  Go to [facility] pharmacy after your [facility] appointment to put a credit card on file.  Call [facility] pharmacy at [phone] (Monday-Friday 7am-9pm or Saturday and Sunday 9am-5pm) to put a credit card on file.  Bring a credit card or cash on the day of your procedure- please leave with a family member rather than bringing it into the preop area.    Current Visitor Policy:  Visitors must be free of fever and symptoms to be in our facilities.  No more than 2 visitors per patient are allowed.  Additional guidelines may vary, based on patient care area or patient's condition.  Patients in semiprivate rooms may have visitors, but visits should be coordinated so only two total visitors are in a room at a time due to space limitations.  Children younger than age 61 are allowed to visit inpatients.    Thank you for participating in your Preoperative Assessment visit today.    If you have any changes to your health or hospitalizations between now and your surgery, please call us at [phone].    Instructions given to patient via: MyChart and verbal    Pre-Surgery Shower    Bathe with an antibacterial soap such as Dial Antibacterial (Dial Gold antibacterial bar soap - can be purchased at a pharmacy or [employer]).    Shower with this soap the DAY BEFORE and the MORNING OF your surgery.    This mild soap reduces the amount of germs on your skin that could cause an infection.    1) Shampoo your hair and wash your face with the soap you normally use.    2) From the neck down, apply the antibacterial bar soap with a clean washcloth.      3) Do not shave surgical area.    4) Turn water off to prevent rinsing the antibacterial soap off too soon.  Wash your body gently for 2 minutes.  Pay special attention to the area where you will have your surgery.  Do not wash with your regular soap after the antibacterial soap is used.    5) After this soap has been on your skin for 2 minutes, rinse your body thoroughly.    6) Dennie Bible  yourself dry with a clean, soft towel.    7) Do not put products such as lotion, deodorant, powder, or perfume/aftershave on your hair, face or skin after your shower.    8) Put on freshly laundered clothes and sleep on freshly laundered linens.    9) Wear freshly laundered clothes to the hospital the day of surgery.

## 2023-02-05 DIAGNOSIS — C189 Malignant neoplasm of colon, unspecified: Secondary | ICD-10-CM

## 2023-02-17 ENCOUNTER — Ambulatory Visit: Admit: 2023-02-17 | Discharge: 2023-02-17 | Payer: No Typology Code available for payment source

## 2023-02-17 ENCOUNTER — Encounter: Admit: 2023-02-17 | Discharge: 2023-02-17 | Payer: No Typology Code available for payment source

## 2023-02-17 DIAGNOSIS — C801 Malignant (primary) neoplasm, unspecified: Secondary | ICD-10-CM

## 2023-02-17 DIAGNOSIS — M1990 Unspecified osteoarthritis, unspecified site: Secondary | ICD-10-CM

## 2023-02-17 DIAGNOSIS — R911 Solitary pulmonary nodule: Secondary | ICD-10-CM

## 2023-02-17 DIAGNOSIS — M549 Dorsalgia, unspecified: Secondary | ICD-10-CM

## 2023-02-17 DIAGNOSIS — C189 Malignant neoplasm of colon, unspecified: Secondary | ICD-10-CM

## 2023-02-17 MED ORDER — DEXMEDETOMIDINE IN 0.9 % NACL 20 MCG/5 ML (4 MCG/ML) IV SYRG
INTRAVENOUS | 0 refills | Status: DC
Start: 2023-02-17 — End: 2023-02-17

## 2023-02-17 MED ORDER — LIDOCAINE (PF) 20 MG/ML (2 %) IJ SOLN
INTRAVENOUS | 0 refills | Status: DC
Start: 2023-02-17 — End: 2023-02-17

## 2023-02-17 MED ORDER — PROPOFOL 10 MG/ML IV EMUL 50 ML (INFUSION)(AM)(OR)
INTRAVENOUS | 0 refills | Status: DC
Start: 2023-02-17 — End: 2023-02-17
  Administered 2023-02-17: 15:00:00 125 ug/kg/min via INTRAVENOUS

## 2023-02-17 MED ORDER — PROPOFOL INJ 10 MG/ML IV VIAL
INTRAVENOUS | 0 refills | Status: DC
Start: 2023-02-17 — End: 2023-02-17

## 2023-02-17 MED ADMIN — SODIUM CHLORIDE 0.9 % IV SOLP [27838]: 250 mL | INTRAVENOUS | @ 14:00:00 | Stop: 2023-02-17 | NDC 00338004902

## 2023-02-19 ENCOUNTER — Encounter: Admit: 2023-02-19 | Discharge: 2023-02-19 | Payer: No Typology Code available for payment source

## 2023-02-19 DIAGNOSIS — M549 Dorsalgia, unspecified: Secondary | ICD-10-CM

## 2023-02-19 DIAGNOSIS — M1990 Unspecified osteoarthritis, unspecified site: Secondary | ICD-10-CM

## 2023-02-19 DIAGNOSIS — R911 Solitary pulmonary nodule: Secondary | ICD-10-CM

## 2023-02-19 DIAGNOSIS — C801 Malignant (primary) neoplasm, unspecified: Secondary | ICD-10-CM

## 2023-02-19 DIAGNOSIS — C189 Malignant neoplasm of colon, unspecified: Secondary | ICD-10-CM

## 2023-03-26 ENCOUNTER — Encounter: Admit: 2023-03-26 | Discharge: 2023-03-26 | Payer: No Typology Code available for payment source

## 2023-03-26 NOTE — Telephone Encounter
-----   Message from Candida Peeling, APRN-NP sent at 03/26/2023 11:57 AM CDT -----  Looks like plan was to do home dilations. Please make sure patient has kit and brings to appointment on Tuesday.    Thanks,    Brunilda Payor. Fredricka Bonine MSN, APRN, AGCNS-BC, AGPCNP-BC  Advanced Practice Provider  Colon and Rectal Surgery  Surgical Oncology  APP for Dr. Kathee Delton, Dr. Daphine Deutscher and Dr. Daiva Nakayama  Pager: [phone]  Available via Amie Critchley and [facility] Connect

## 2023-03-26 NOTE — Progress Notes
.This prechart is intended to be a reference for patient appointments. Information is gathered from in chart as well as external records review. Information will be clarified/verified/updated in final documentation in the office visit.     Pre Clinic Pre Chart:    Terri Eaton is a 75F with a history of rectal cancer just above the dentate line that has been previously trans-anally excised with residual adenomatous tumor. She underwent a repeat transanal excision on 08/20/2021. 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 It was removed in pieces and cautery limited margin evaluation. This was transanally excised again on 11/11/2021. 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 She was subsequently diagnosed with an anal stricture and was planned for dilation but she had difficulty keeping appointments. She was requiring Miralax three times a day. She underwent a EUA, anoscopy and dilation on 02/17/2023. Now planning for home dilations.    DILATION  MRI  Colonoscopy     Problem List:          02/17/2023 - 1. Examination under anesthesia. 2. Anoscopy. 3. Dilation of anorectal stricture.  Findings:  anal stricture, dilated     11/10/2022 - MRI Pelv:  1. Re-demonstration of prominent slide increased T2 signal in posterior lower rectal mucosa close to the anorectal junction and proximal anal canal between 4 o'clock and 9 o'clock lithotomy position.  No gross mass demonstrated. After IV contrast administration, there is no prominent abnormal enhancement. Occult mucosal based disease could not be ruled out.  Correlation with physical exam and or anal/rectal scoping could be considered.   2. Stable appearance to small mesorectal lymph node   3. Small urinary bladder diverticula versus cyst along the urachal remnant at midline (0.7 x 0.8 mm). This is stable.     03/11/2022 - 1. Colonoscopy with cold forceps polypectomy times four 2. Dilation of anal canal.  Findings:  We performed a digital rectal exam, which revealed some narrowing around the anal canal that I was able to dilate with digital exam.  Ultimately, I used the dilators of large size to dilate this area.  We then introduced the Olympus variable stiffness high-definition colonoscope transanally in slow and sequential manner advanced to the cecum.  The cecum was identified by the ileocecal valve, which we could see actively bubbling.  It should be noted her prep was poor.  There was formed stool scattered throughout the starting at the proximal descending colon, across the transverse and then the ascending colon.  We were able to visualize approximately 70% of the mucosal surfaces with irrigation, however, it was not complete.  We used pulse irrigation for visualization as much as we could.   Pathology:  A. Colonic mucosa, rectal polyp, biopsy:   Hyperplastic polyp.       B. Colonic mucosa, rectal polyp #2, biopsy:   Hyperplastic polyp.       C. Colonic mucosa, rectal polyp #3, biopsy:   Sessile serrated polyp/adenoma.         D. Colonic mucosa, rectal polyp #4, biopsy:   Hyperplastic polyp.        11/11/2021 - 1. Transanal excision of rectal tumor 2. Dilation of anal stricture  Findings:  Stenosed anus, initially unable to accommodate index finger. Dilation performed and able to accommodate a medium sized Hill-Ferguson retractor. Small amount of residual circumferential tumor, extremely friable. Transanal excision performed.   Pathology:  A. Colonic and squamous mucosa, polyps, transanal resection:   Fragments of tubulovillous adenoma with a background of  granulation   tissue.   Margins cannot be assessed due to fragmentation of the specimen.   Negative for residual carcinoma.

## 2023-03-26 NOTE — Telephone Encounter
Attempted to call Joycelyn Rua regarding plan for home dilations.  Will need to purchase kit and bring to appointment on Tuesday 9/3. There was no answer when I called and voicemail is not set up.  I will also send a mychart message.

## 2023-03-29 IMAGING — CT CT head/brain wo con
3 series · 16 of 47 positions shown, 19 images · non-contrast
Comparison: None.

HEADWO
REASON FOR EXAM: Fall, right posterior head injury
TECHNIQUE: Routine non-contrast enhanced axial images were obtained for the skull base to the
vertex. Post processed coronal and sagittal reformats were also reviewed.

[Series 3: soft tissue wo · axial · 0.43mm/px · z∈[+10,+140]mm · 10 of 32 slices shown, 13 images]
[im 3/32  brain]
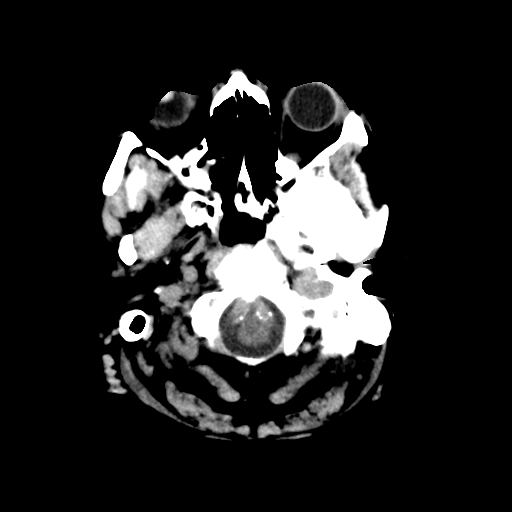
[im 3/32  bone]
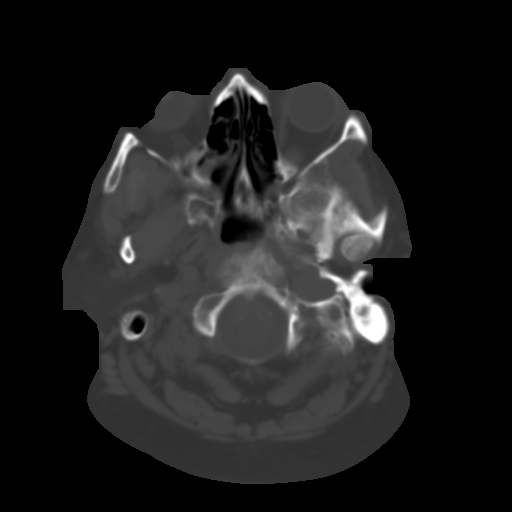
[im 6/32  brain]
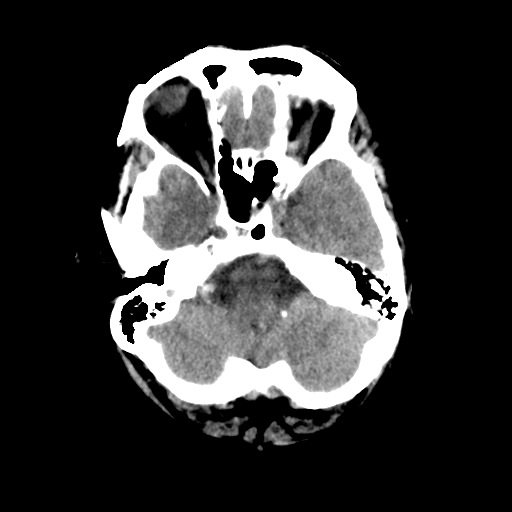
[im 9/32  brain]
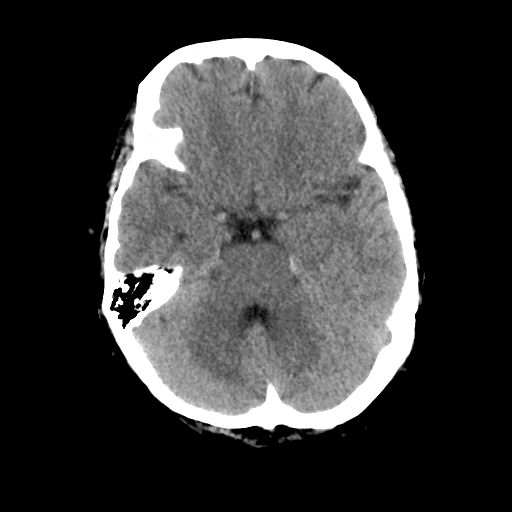
[im 11/32  brain]
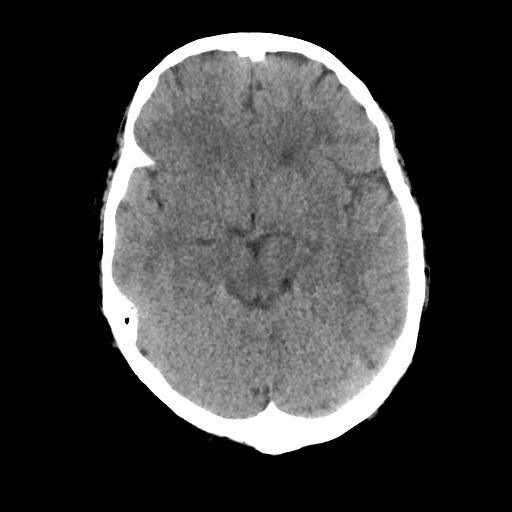
[im 14/32  brain]
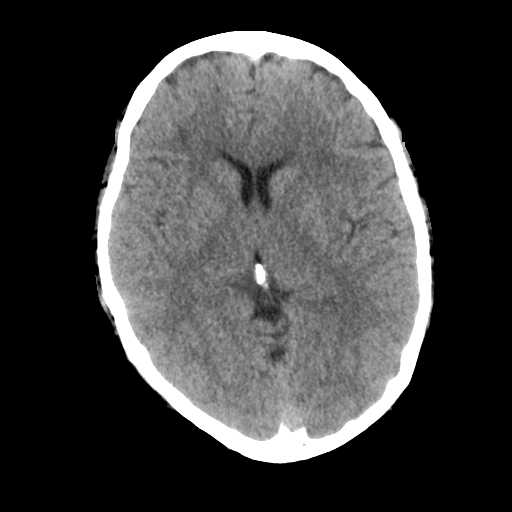
[im 14/32  bone]
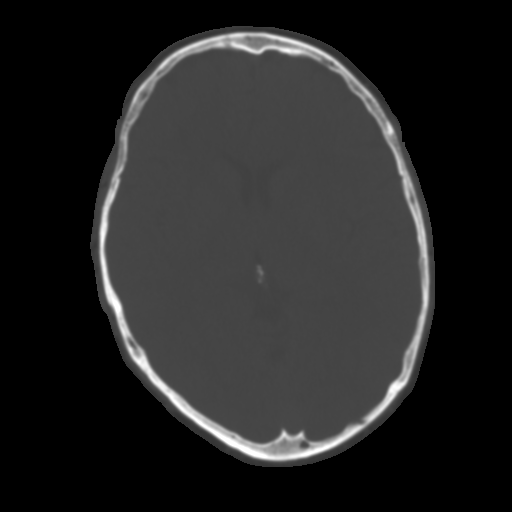
[im 18/32  brain]
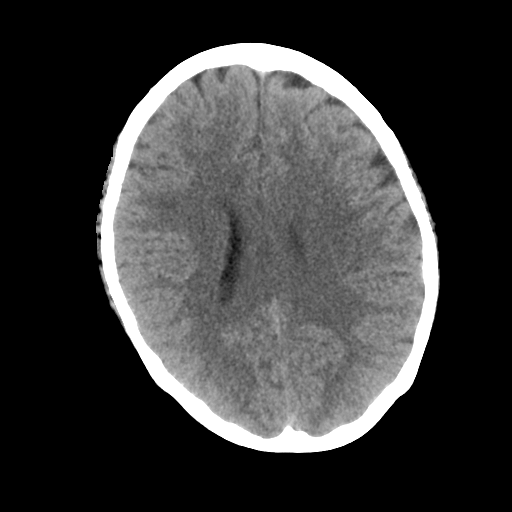
[im 21/32  brain]
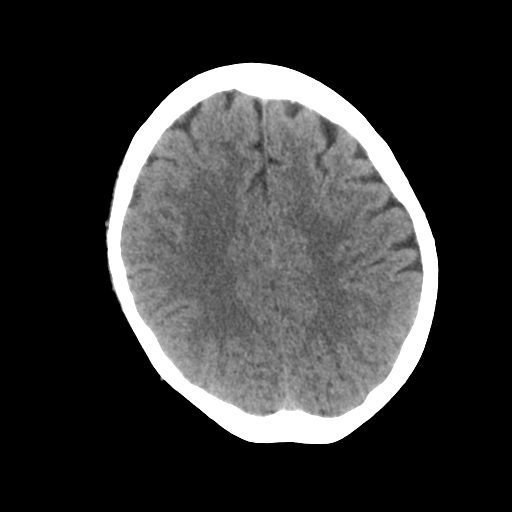
[im 24/32  brain]
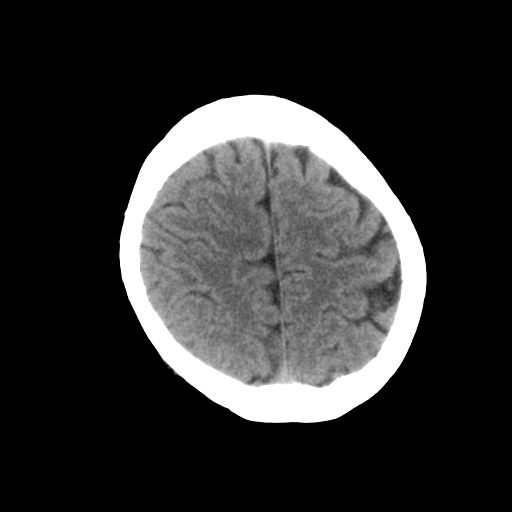
[im 26/32  brain]
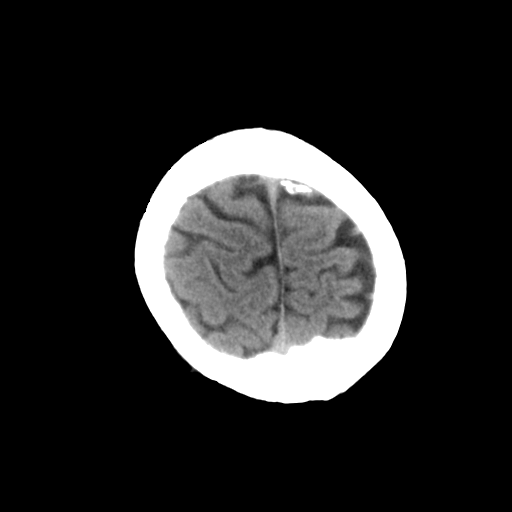
[im 26/32  bone]
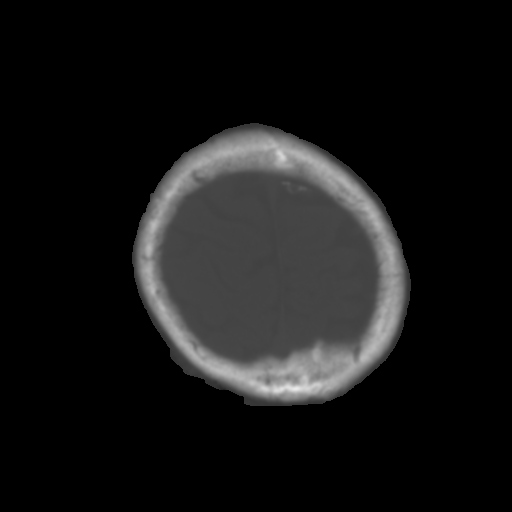
[im 29/32  brain]
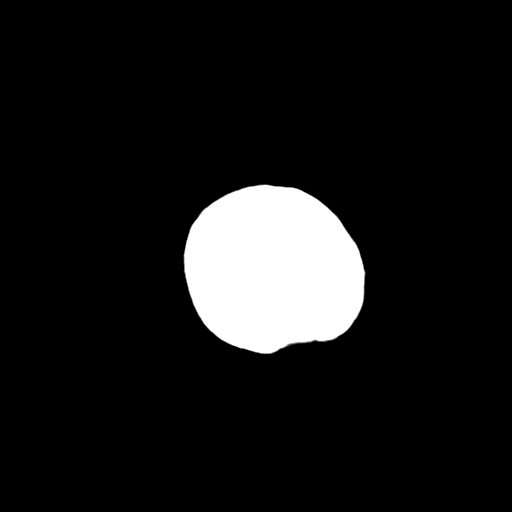

[Series 1001: coronal wo · coronal · 0.43mm/px · 3 of 69 slices shown]
[im 23/69  brain]
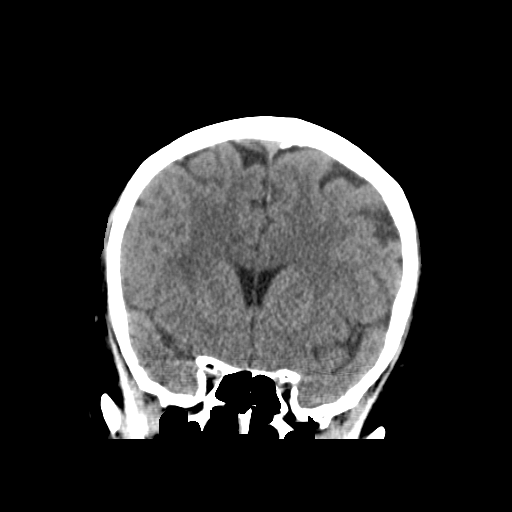
[im 31/69  brain]
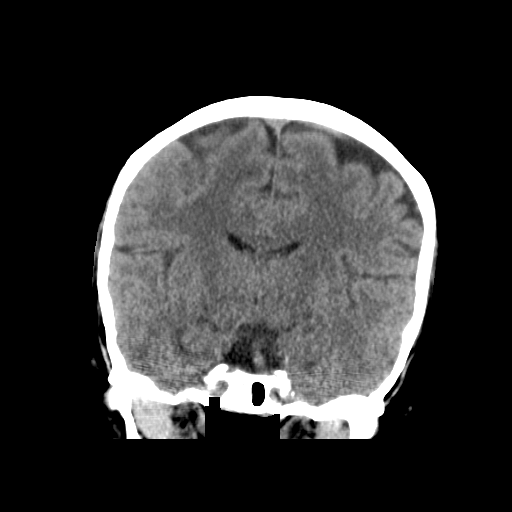
[im 38/69  brain]
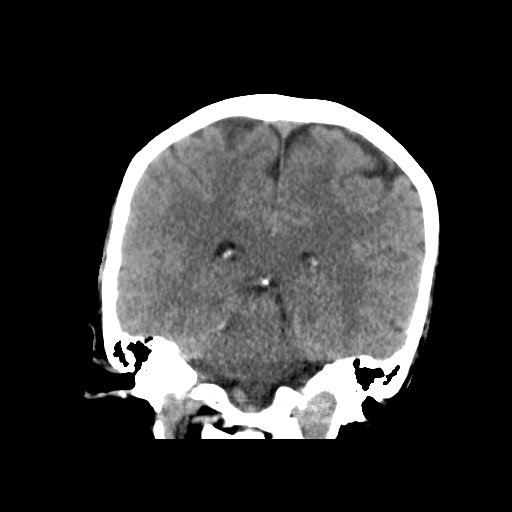

[Series 1002: sagittal wo · sagittal · 0.43mm/px · 3 of 63 slices shown]
[im 21/63  brain]
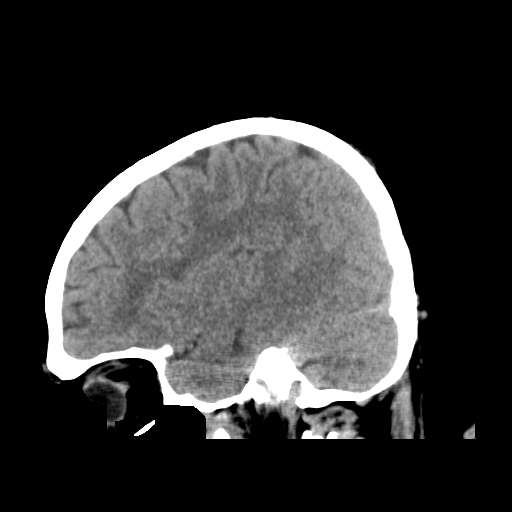
[im 32/63  brain]
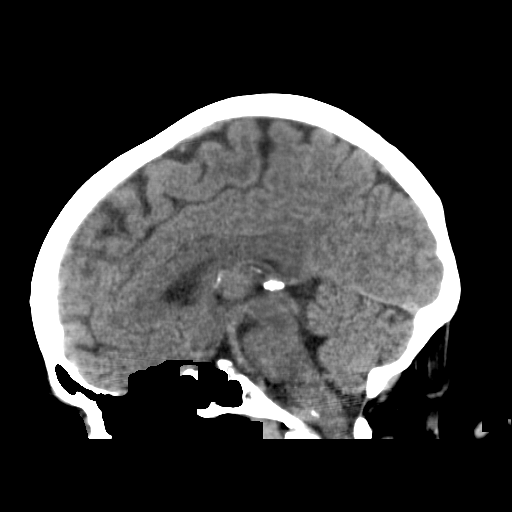
[im 42/63  brain]
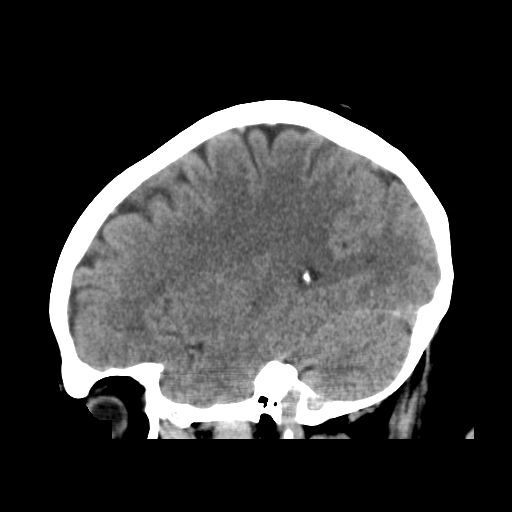

[16 of 47 positions shown; findings below may reference images not displayed]

FINDINGS: No midline shift, intra or extra-axial hemorrhage.
The calvarium is intact. Paranasal sinuses are clear. The mastoid air cells are clear. The globes
and orbits are unremarkable bilaterally.
No acute territorial ischemia. The ventricles and cortical sulci are within normal limits. Focal
areas of decreased attenuation within the subcortical and periventricular white matter are noted.
IMPRESSION: 1. No acute intracranial hemorrhage, mass, or large acute territorial ischemia.
2. Scattered nonspecific periventricular hypoattenuation likely representing chronic microvascular
changes.

## 2023-03-31 ENCOUNTER — Encounter: Admit: 2023-03-31 | Discharge: 2023-03-31 | Payer: No Typology Code available for payment source

## 2023-03-31 DIAGNOSIS — C189 Malignant neoplasm of colon, unspecified: Secondary | ICD-10-CM

## 2023-03-31 DIAGNOSIS — M549 Dorsalgia, unspecified: Secondary | ICD-10-CM

## 2023-03-31 DIAGNOSIS — K624 Stenosis of anus and rectum: Secondary | ICD-10-CM

## 2023-03-31 DIAGNOSIS — M1990 Unspecified osteoarthritis, unspecified site: Secondary | ICD-10-CM

## 2023-03-31 DIAGNOSIS — C801 Malignant (primary) neoplasm, unspecified: Secondary | ICD-10-CM

## 2023-03-31 DIAGNOSIS — R911 Solitary pulmonary nodule: Secondary | ICD-10-CM

## 2023-03-31 DIAGNOSIS — C20 Malignant neoplasm of rectum: Secondary | ICD-10-CM

## 2023-03-31 NOTE — Progress Notes
AUTH NEEDED FOR EXTERNAL IMAGING    Patient info:  Name: Terri Eaton    MRN: [id number]          DOB: Oct 04, 1965      Insurance:   Payor: Bea Graff / Plan: Rolene Arbour Holden MEDICARE ADV / Product Type: *No Product type* /      IF INSURANCE IS [place] HAVE YOU ALREADY SENT OVER THE [facility] Provider Request for Service Form - No  This MUST be sent first. This will DENY Authorizations if it has not been sent yet.     CNC Sent Order to Rendering Facility:  No  *Please fax order as soon as done with this encounter if answering no.    What Facility was the order sent to (if multiple locations please specify address):   [facility] in [place]      Type of scan ordered:  MRI Pelvis wo/w contrast    Diagnosis ICD10 Code:  Rectal Cancer C20    CPT Code:  47829    Ordering Provider:  Deberah Pelton, APRN    Scan needed by date, if unknown please put approximate date:  04/07/2023

## 2023-03-31 NOTE — Progress Notes
Order for MRI Pelvis 3T faxed to [facility] in [place] (fax: [phone].)  Requested they call patient and scheduled this MRI for first week of October.  Fax confirmation was received.    I called patient and received her vm.  Let her know this was faxed and she may call [phone] to scheduling also if she does not hear from them after a couple days.  I told her I would call and follow up next week about when this is scheduled.

## 2023-04-02 ENCOUNTER — Encounter: Admit: 2023-04-02 | Discharge: 2023-04-02 | Payer: No Typology Code available for payment source

## 2023-04-02 DIAGNOSIS — C20 Malignant neoplasm of rectum: Secondary | ICD-10-CM

## 2023-04-10 ENCOUNTER — Encounter: Admit: 2023-04-10 | Discharge: 2023-04-10 | Payer: No Typology Code available for payment source

## 2023-04-20 ENCOUNTER — Encounter: Admit: 2023-04-20 | Discharge: 2023-04-20 | Payer: No Typology Code available for payment source

## 2023-04-20 NOTE — Progress Notes
Spoke with radiology scheduling [facility] in [place] and they confirmed patient has been scheduled for MRI pelvi on 10/1 at 1230.  Results should come through [facility].  Will follow up with [facility].

## 2023-04-28 ENCOUNTER — Encounter: Admit: 2023-04-28 | Discharge: 2023-04-28 | Payer: MEDICARE

## 2023-04-28 NOTE — Telephone Encounter
Pt called today stating she got sick last night and cannot make it to her MRI at [facility]. Thelma Barge today at 1230pm. She has an appt at [facility] on 10/3. She asked if we could just r/s it then. I told her the MRI at [facility] is most likely scheduling into December. She needs to call [facility] directly and r/s this. I asked her to let us know what date it gets r/s to.

## 2023-04-30 ENCOUNTER — Ambulatory Visit: Admit: 2023-04-30 | Discharge: 2023-04-30 | Payer: MEDICARE

## 2023-04-30 ENCOUNTER — Encounter: Admit: 2023-04-30 | Discharge: 2023-04-30 | Payer: MEDICARE

## 2023-04-30 DIAGNOSIS — M1990 Unspecified osteoarthritis, unspecified site: Secondary | ICD-10-CM

## 2023-04-30 DIAGNOSIS — C189 Malignant neoplasm of colon, unspecified: Secondary | ICD-10-CM

## 2023-04-30 DIAGNOSIS — Z6841 Body Mass Index (BMI) 40.0 and over, adult: Secondary | ICD-10-CM

## 2023-04-30 DIAGNOSIS — G4733 Obstructive sleep apnea (adult) (pediatric): Secondary | ICD-10-CM

## 2023-04-30 DIAGNOSIS — R911 Solitary pulmonary nodule: Secondary | ICD-10-CM

## 2023-04-30 DIAGNOSIS — C801 Malignant (primary) neoplasm, unspecified: Secondary | ICD-10-CM

## 2023-04-30 DIAGNOSIS — M549 Dorsalgia, unspecified: Secondary | ICD-10-CM

## 2023-04-30 DIAGNOSIS — Z789 Other specified health status: Secondary | ICD-10-CM

## 2023-04-30 NOTE — Patient Instructions
[employer] follows medicare rules. Their policy is a BMI of less than 35.    With a height of 5'3, your weight will need to be 197lbs.

## 2023-04-30 NOTE — Progress Notes
HISTORY OF PRESENT ILLNESS:  Terri Eaton is a 57 y.o. female who presents to clinic today on consultation for sleep apnea.  She struggles with CPAP because of mask fit and keeping it on all night.  She has felt better since starting CPAP in general.  Was interested in inspire placement because of her difficulties keeping the mask on.  She had a sleep study showing an AHI of 52 with 164 minutes below 80% saturation.  Currently has a BMI of 44.  Interested in starting weight loss drugs.       Review of Systems   Constitutional:  Positive for fatigue.   HENT: Negative.     Eyes: Negative.    Respiratory:  Positive for apnea.    Cardiovascular: Negative.    Gastrointestinal: Negative.    Endocrine: Negative.    Genitourinary: Negative.    Musculoskeletal: Negative.    Skin: Negative.    Allergic/Immunologic: Negative.    Neurological: Negative.    Hematological: Negative.    Psychiatric/Behavioral: Negative.         Past Medical/Surgical History  She  has a past medical history of Arthritis, Back pain, Cancer (HCC) (11/09/2020), Cancer of colon [facility]), and Lung nodule seen on imaging study (12/14/2020).  Her  has a past surgical history that includes knee replacement (Right, 2018); carpal tunnel release (Bilateral); tumor excision (N/A, 08/20/2021); tumor excision (N/A, 11/11/2021); Colonoscopy (N/A, 03/11/2022); anoscopy (N/A, 03/11/2022); tumor excision (11/09/2020); tonsillectomy; Bladder surgery; and anoscopy (N/A, 02/17/2023).        Medications/Allergies/Immunizations  Her current medication(s) include:   Current Outpatient Medications   Medication Sig Dispense Refill    albuterol sulfate (PROAIR HFA) 90 mcg/actuation HFA aerosol inhaler Inhale two puffs by mouth into the lungs every 6 hours as needed for Wheezing or Shortness of Breath.      atorvastatin (LIPITOR) 10 mg tablet Take one tablet by mouth at bedtime daily.      diphenhydrAMINE hcl (BENADRYL ALLERGY) 25 mg tablet Take two tablets by mouth at bedtime daily.      docusate (COLACE) 100 mg capsule Take two capsules by mouth at bedtime daily.      fexofenadine (ALLEGRA) 180 mg tablet Take one tablet by mouth at bedtime daily.      gabapentin (NEURONTIN) 300 mg capsule Take 1 in the morning and 2 at bedtime      losartan (COZAAR) 50 mg tablet Take one tablet by mouth at bedtime daily.      pantoprazole DR (PROTONIX) 40 mg tablet Take one tablet by mouth at bedtime daily.      polyethylene glycol 3350 (MIRALAX) 17 g packet Take one packet by mouth daily as needed.      promethazine (PHENERGAN) 25 mg tablet Take one tablet by mouth every 6 hours as needed.      sertraline (ZOLOFT) 100 mg tablet Take two tablets by mouth at bedtime daily.      tiZANidine (ZANAFLEX) 4 mg capsule Take one capsule by mouth at bedtime daily.       No current facility-administered medications for this visit.       Allergies: Duloxetine; Latex, natural rubber; Sulfa (sulfonamide antibiotics); Cephalexin; and Lisinopril          Vitals:    04/30/23 0811   BP: 123/77   Pulse: 63   PainSc: Zero   Weight: 114.9 kg (253 lb 6.4 oz)   Height: 160 cm (5' 3)     Body mass index is 44.89  kg/m?.     Physical Exam    General: 57 y.o. female who is awake and alert and no acute distress.  BMI 44  SKIN:  Skin examination was unremarkable no mass or lesion appreciated no evidence of cellulitis.    EYES: Extraocular muscle mobility is intact.  No conjunctival hemorrhage.   EARS:The auricles were without deformity.  External auditory canals are clear no evidence of cerumen fungus or bacterial infection.  Tympanic membranes are without effusion or retraction.  No evidence of perforation.  No cholesteatoma.    NOSE: The nasal airway shows a straight septum without evidence of perforation or significant crusting.  There are no evidence of polypoid changes.  The inferior turbinate is not congested.  There are no active bleeding sites.    ORAL CAVITY: The oral cavity shows buccal surfaces are without evidence of lichenoid changes or mucosal disease.  No leukoplakia.  The floor of mouth is without edema.  Wharton's ducts and Stensen's ducts are patent with normal salivary flow.  The tongue is without mass or lesion. There is normal mobility and sensation.    OROPHARYNX: The oropharynx shows normal mucosa.   No significant postnasal drainage.  Uvula is without edema.  No tonsil obstruction  NECK: Neck was flat no adenopathy or thyromegaly.  No parotid masses or submandibular gland masses.    NEURO: Cranial nerves are intact bilaterally.  Voice quality is excellent.   PULMONARY:  No airway distress.  No stridor.  No retractions.        ASSESSMENT AND PLAN:       Terri Eaton Has a history of obstructive sleep apnea and CPAP intolerance.  Has a body mass index that is too high but has tried and failed CPAP as detailed above.  Recent sleep study shows at least moderate obstructive sleep apnea without significant central events.  Severe desaturations.  At this point cannot pursue inspire until weight loss is achieved.   Discussed the risk and potential complications of both drug-induced sleep endoscopy and hypoglossal nerve stimulator placement.  These would include but are not limited to continued sleep apnea, device failure, need for battery change, infection, bleeding, scarring, nerve damage, difficulties with certain MRI scans, pain.  The patient understands and is wanting to proceed with continued work-up.

## 2023-05-07 NOTE — Progress Notes
.This prechart is intended to be a reference for patient appointments. Information is gathered from in chart as well as external records review. Information will be clarified/verified/updated in final documentation in the office visit.     Pre Clinic Pre Chart:    Ices is a 37F with a history of rectal cancer just above the dentate line that has been previously trans-anally excised with residual adenomatous tumor. She underwent a repeat transanal excision on 08/20/2021. 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 It was removed in pieces and cautery limited margin evaluation. This was transanally excised again on 11/11/2021. 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 She was subsequently diagnosed with an anal stricture and was planned for dilation but she had difficulty keeping appointments. She was requiring Miralax three times a day. She underwent a EUA, anoscopy and dilation on 02/17/2023. Now planning for home dilations.    DILATION  MRI  Colonoscopy     Problem List:          02/17/2023 - 1. Examination under anesthesia. 2. Anoscopy. 3. Dilation of anorectal stricture.  Findings:  anal stricture, dilated     11/10/2022 - MRI Pelv:  1. Re-demonstration of prominent slide increased T2 signal in posterior lower rectal mucosa close to the anorectal junction and proximal anal canal between 4 o'clock and 9 o'clock lithotomy position.  No gross mass demonstrated. After IV contrast administration, there is no prominent abnormal enhancement. Occult mucosal based disease could not be ruled out.  Correlation with physical exam and or anal/rectal scoping could be considered.   2. Stable appearance to small mesorectal lymph node   3. Small urinary bladder diverticula versus cyst along the urachal remnant at midline (0.7 x 0.8 mm). This is stable.     03/11/2022 - 1. Colonoscopy with cold forceps polypectomy times four 2. Dilation of anal canal.  Findings:  We performed a digital rectal exam, which revealed some narrowing around the anal canal that I was able to dilate with digital exam.  Ultimately, I used the dilators of large size to dilate this area.  We then introduced the Olympus variable stiffness high-definition colonoscope transanally in slow and sequential manner advanced to the cecum.  The cecum was identified by the ileocecal valve, which we could see actively bubbling.  It should be noted her prep was poor.  There was formed stool scattered throughout the starting at the proximal descending colon, across the transverse and then the ascending colon.  We were able to visualize approximately 70% of the mucosal surfaces with irrigation, however, it was not complete.  We used pulse irrigation for visualization as much as we could.   Pathology:  A. Colonic mucosa, rectal polyp, biopsy:   Hyperplastic polyp.       B. Colonic mucosa, rectal polyp #2, biopsy:   Hyperplastic polyp.       C. Colonic mucosa, rectal polyp #3, biopsy:   Sessile serrated polyp/adenoma.         D. Colonic mucosa, rectal polyp #4, biopsy:   Hyperplastic polyp.        11/11/2021 - 1. Transanal excision of rectal tumor 2. Dilation of anal stricture  Findings:  Stenosed anus, initially unable to accommodate index finger. Dilation performed and able to accommodate a medium sized Hill-Ferguson retractor. Small amount of residual circumferential tumor, extremely friable. Transanal excision performed.   Pathology:  A. Colonic and squamous mucosa, polyps, transanal resection:   Fragments of tubulovillous adenoma with a background of  granulation   tissue.   Margins cannot be assessed due to fragmentation of the specimen.   Negative for residual carcinoma.

## 2023-05-08 ENCOUNTER — Encounter: Admit: 2023-05-08 | Discharge: 2023-05-08 | Payer: MEDICARE

## 2023-05-08 DIAGNOSIS — C801 Malignant (primary) neoplasm, unspecified: Secondary | ICD-10-CM

## 2023-05-08 DIAGNOSIS — C20 Malignant neoplasm of rectum: Secondary | ICD-10-CM

## 2023-05-08 DIAGNOSIS — M1990 Unspecified osteoarthritis, unspecified site: Secondary | ICD-10-CM

## 2023-05-08 DIAGNOSIS — M549 Dorsalgia, unspecified: Secondary | ICD-10-CM

## 2023-05-08 DIAGNOSIS — C189 Malignant neoplasm of colon, unspecified: Secondary | ICD-10-CM

## 2023-05-08 DIAGNOSIS — K624 Stenosis of anus and rectum: Secondary | ICD-10-CM

## 2023-05-08 DIAGNOSIS — R911 Solitary pulmonary nodule: Secondary | ICD-10-CM

## 2023-05-08 NOTE — Progress Notes
Name: Terri Eaton          MRN: [id number]      DOB: 1965-08-11      AGE: 57 y.o.   DATE OF SERVICE: 05/08/2023    Subjective:             Reason for Visit:  Cancer Follow up      Terri Eaton is a 57 y.o. female.      Cancer Staging   No matching staging information was found for the patient.      History of Present Illness  Terri Eaton is a 87F with a history of rectal cancer just above the dentate line that has been previously trans-anally excised with residual adenomatous tumor. She underwent a repeat transanal excision on 08/20/2021. 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 It was removed in pieces and cautery limited margin evaluation. This was transanally excised again on 11/11/2021. 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 She was subsequently diagnosed with an anal stricture and was planned for dilation but she had difficulty keeping appointments. She was requiring Miralax three times a day. She underwent a EUA, anoscopy and dilation on 02/17/2023. Now planning for home dilations.    Terri Eaton presents for a scheduled dilation teaching today. She reports a recent improvement in bowel movements, having established a regimen that includes a stool softener and Miralax. She takes one stool softener as needed and adds Miralax to her food when she experiences difficulty with bowel movements. She denies any rectal bleeding. The patient also mentions an upcoming MRI that was cancelled and she hasn't rescheduled but plans to.     02/17/2023 - 1. Examination under anesthesia. 2. Anoscopy. 3. Dilation of anorectal stricture.  Findings:  anal stricture, dilated     11/10/2022 - MRI Pelv:  1. Re-demonstration of prominent slide increased T2 signal in posterior lower rectal mucosa close to the anorectal junction and proximal anal canal between 4 o'clock and 9 o'clock lithotomy position.  No gross mass demonstrated. After IV contrast administration, there is no prominent abnormal enhancement. Occult mucosal based disease could not be ruled out.  Correlation with physical exam and or anal/rectal scoping could be considered.   2. Stable appearance to small mesorectal lymph node   3. Small urinary bladder diverticula versus cyst along the urachal remnant at midline (0.7 x 0.8 mm). This is stable.     03/11/2022 - 1. Colonoscopy with cold forceps polypectomy times four 2. Dilation of anal canal.  Findings:  We performed a digital rectal exam, which revealed some narrowing around the anal canal that I was able to dilate with digital exam.  Ultimately, I used the dilators of large size to dilate this area.  We then introduced the Olympus variable stiffness high-definition colonoscope transanally in slow and sequential manner advanced to the cecum.  The cecum was identified by the ileocecal valve, which we could see actively bubbling.  It should be noted her prep was poor.  There was formed stool scattered throughout the starting at the proximal descending colon, across the transverse and then the ascending colon.  We were able to visualize approximately 70% of the mucosal surfaces with irrigation, however, it was not complete.  We used pulse irrigation for visualization as much as we could.   Pathology:  A. Colonic mucosa, rectal polyp, biopsy:   Hyperplastic polyp.       B. Colonic mucosa, rectal polyp #2, biopsy:   Hyperplastic polyp.  C. Colonic mucosa, rectal polyp #3, biopsy:   Sessile serrated polyp/adenoma.         D. Colonic mucosa, rectal polyp #4, biopsy:   Hyperplastic polyp.        11/11/2021 - 1. Transanal excision of rectal tumor 2. Dilation of anal stricture  Findings:  Stenosed anus, initially unable to accommodate index finger. Dilation performed and able to accommodate a medium sized Hill-Ferguson retractor. Small amount of residual circumferential tumor, extremely friable. Transanal excision performed.   Pathology:  A. Colonic and squamous mucosa, polyps, transanal resection:   Fragments of tubulovillous adenoma with a background of granulation   tissue.   Margins cannot be assessed due to fragmentation of the specimen.   Negative for residual carcinoma.     Past Medical History:   Diagnosis Date    Arthritis     Back pain     Cancer (HCC) 11/09/2020    colon cancer; focal moderately differentiated  adenocarcinoma    Cancer of colon (HCC)     Lung nodule seen on imaging study 12/14/2020    A few small nodules in the lung bases measure up to 4 mm     Surgical History:   Procedure Laterality Date    KNEE REPLACEMENT Right 2018    TUMOR EXCISION  11/09/2020    rectal    TRANSANAL EXCISION RECTAL TUMOR - FULL THICKNESS N/A 08/20/2021    Performed by Costella Hatcher, DO at [facility]    TRANSANAL EXCISION RECTAL TUMOR - FULL THICKNESS N/A 11/11/2021    Performed by Costella Hatcher, DO at [facility]    COLONOSCOPY DIAGNOSTIC WITH SPECIMEN COLLECTION BY BRUSHING/ WASHING - FLEXIBLE N/A 03/11/2022    Performed by Costella Hatcher, DO at [facility]    ANOSCOPY WITH DILATION N/A 03/11/2022    Performed by Costella Hatcher, DO at [facility]    EXAM UNDER ANESTHESIA, DILATION ANAL STRICTURE N/A 02/17/2023    Performed by Costella Hatcher, DO at [facility]    ANOSCOPY WITH DILATION N/A 02/17/2023    Performed by Costella Hatcher, DO at [facility]    BLADDER SURGERY      HX CARPAL TUNNEL RELEASE Bilateral     HX TONSILLECTOMY       No family history on file.  Social History     Socioeconomic History    Marital status: Married   Tobacco Use    Smoking status: Never    Smokeless tobacco: Never   Vaping Use    Vaping status: Never Used   Substance and Sexual Activity    Alcohol use: Not Currently     Comment: ~ 1 every 1-2 months    Drug use: Never     Vaping/E-liquid Use    Vaping Use Never User                      Review of Systems   Constitutional: Negative.  Negative for chills, fever and unexpected weight change.   HENT: Negative.     Eyes: Negative.    Respiratory: Negative.     Cardiovascular: Negative. Gastrointestinal:  Positive for constipation. Negative for abdominal distention, abdominal pain, anal bleeding, blood in stool, diarrhea, nausea, rectal pain and vomiting.   Endocrine: Negative.    Genitourinary: Negative.    Musculoskeletal: Negative.    Skin: Negative.  Negative for color change and wound.   Allergic/Immunologic: Negative.    Neurological: Negative.  Negative  for weakness and light-headedness.   Hematological: Negative.    Psychiatric/Behavioral: Negative.     All other systems reviewed and are negative.        Objective:          albuterol sulfate (PROAIR HFA) 90 mcg/actuation HFA aerosol inhaler Inhale two puffs by mouth into the lungs every 6 hours as needed for Wheezing or Shortness of Breath.    atorvastatin (LIPITOR) 10 mg tablet Take one tablet by mouth at bedtime daily.    diphenhydrAMINE hcl (BENADRYL ALLERGY) 25 mg tablet Take two tablets by mouth at bedtime daily.    docusate (COLACE) 100 mg capsule Take two capsules by mouth at bedtime daily.    fexofenadine (ALLEGRA) 180 mg tablet Take one tablet by mouth at bedtime daily.    gabapentin (NEURONTIN) 300 mg capsule Take 1 in the morning and 2 at bedtime    losartan (COZAAR) 50 mg tablet Take one tablet by mouth at bedtime daily.    pantoprazole DR (PROTONIX) 40 mg tablet Take one tablet by mouth at bedtime daily.    polyethylene glycol 3350 (MIRALAX) 17 g packet Take one packet by mouth daily as needed.    promethazine (PHENERGAN) 25 mg tablet Take one tablet by mouth every 6 hours as needed.    sertraline (ZOLOFT) 100 mg tablet Take two tablets by mouth at bedtime daily.    tiZANidine (ZANAFLEX) 4 mg capsule Take one capsule by mouth at bedtime daily.     Vitals:    05/08/23 1211   Resp: 18   PainSc: Zero   Weight: 114.8 kg (253 lb)     Body mass index is 44.82 kg/m?Marland Kitchen     Pain Score: Zero       Fatigue Scale: 0-None       Physical Exam  Vitals reviewed.   Constitutional:       General: She is not in acute distress.     Appearance: Normal appearance. She is well-developed.   HENT:      Head: Normocephalic and atraumatic.      Nose: Nose normal.   Eyes:      General: Lids are normal.      Conjunctiva/sclera: Conjunctivae normal.   Pulmonary:      Effort: Pulmonary effort is normal. No respiratory distress.   Genitourinary:     Comments: External: Normal perianal skin.   DRE: Slight stricture able to pass finger easily. No masses, lesions, or ulcerations. Large dilator was able to be passed easily.   Musculoskeletal:         General: Normal range of motion.      Cervical back: Normal range of motion.   Neurological:      Mental Status: She is alert and oriented to person, place, and time.   Psychiatric:         Speech: Speech normal.         Behavior: Behavior normal.         Thought Content: Thought content normal.         Judgment: Judgment normal.               Assessment and Plan:  76F with a history of rectal cancer just above the dentate line that has been previously trans-anally excised with residual adenomatous tumor. She underwent a repeat transanal excision on 08/20/2021 and again on 11/11/2021. Developed anal stricture s/p dilation on 02/17/2023. Bowels improved and here for dilation.     Anal Stricture:  -  Patient was shown how to dilate today. She was able to demonstrate this back.   - Recommend anal dilation 3x a week passing the 3x and holding in for 15 sec at a time.     Rectal Cancer s/p Transanal Excision:  - Plan for MRI in ~ October 2024. This was scheduled and she has cancelled this. We have previously discussed how important these MRIs are in surveillance. I asked she tell us when the MRI is rescheduled.   - Discussed need for repeat colonoscopy as the last in 02/2022 had incomplete prep. This could be done locally. Would like for her to stay stable from a dilation standpoint prior to this in hopes for better quality prep.   Per NCCN Guidelines:  - Proctoscopy (EUS or MRI) every 3-79mths for the first 65yrs. Then every for a total of 37yrs.   - Surveillance colonoscopy at 33yr from surgery, repeat in 48yrs, and then every 66yrs unless advanced adenoma detected (villous polyp, polyp >1cm, or high grade dysplasia). If advanced adenoma detected repeat in 58yr.    Plan:  - RTC .     Total Time Today was 25 minutes in the following activities: Preparing to see the patient, Performing a medically appropriate examination and/or evaluation, Ordering medications, tests, or procedures, and Documenting clinical information in the electronic or other health record    The patient and family were allowed to ask questions and voice concerns; these were addressed to the best of our ability. They expressed understanding of what was explained to them, and they agreed with the present plan. Patient has the phone numbers for the Cancer Center and was instructed on how to contact us with any questions or concerns.    My collaborating provider on this patient is Dr. Jonny Ruiz Ashcraft DO.    Brunilda Payor. Fredricka Bonine MSN, APRN, AGCNS-BC, AGPCNP-BC  Advanced Practice Provider  Colon and Rectal Surgery  Surgical Oncology  APP for Dr. Kathee Delton, Dr. Daphine Deutscher and Dr. Daiva Nakayama  Pager: [phone]  Available via [facility] and [facility] [employer]

## 2023-11-04 ENCOUNTER — Encounter: Admit: 2023-11-04 | Discharge: 2023-11-04

## 2023-11-04 NOTE — Progress Notes
 Received teams message from [facility] scheduling yesterday that patient was requesting to schedule an MRI for her upcoming appointment this Friday with Dr. Kathee Delton on 11/06/2023.  We have attempted to schedule this MRI at outside facility for patient since September.  Pt has no showed or cancelled these MRI appointments.  At last visit with Deberah Pelton, patient was told the importance of these MRIs and to let us know when she has rescheduled.  We have not heard back from her.  I advised OOC scheduler that Gladine Plude should keep her appointment this Friday on 4/11 and we can discuss rescheduling of MRI at that time. If patient still wanting to have done at outside facility, this will need to be pre-certed again.      This morning it was noted that patient has cancelled her Friday 4/11 appointment with Dr. Kathee Delton and rescheduled to July.  Per OOC scheduler, Sande Rives, patient requested to reschedule as she was sick with sinus infections and wouldn't make it.  I reviewed and discussed with Deberah Pelton, nurse practitioner this morning.  She agreed to keep patient as scheduled in July.

## 2024-02-02 ENCOUNTER — Encounter: Admit: 2024-02-02 | Discharge: 2024-02-02 | Payer: MEDICARE

## 2024-02-05 ENCOUNTER — Encounter: Admit: 2024-02-05 | Discharge: 2024-02-05 | Payer: MEDICARE

## 2024-02-05 NOTE — Progress Notes
 AUTH NEEDED FOR EXTERNAL IMAGING    Patient info:  Name: Terri Eaton    MRN: [id number]          DOB: [birth year]      Insurance:   Payor: [employer]ICARE / Plan: [employer] CHOICE PPO MEDICARE / Product Type: Medicare /      IF INSURANCE IS VA HAVE YOU ALREADY SENT OVER THE [facility] Provider Request for Service Form - No  This MUST be sent first. This will DENY Authorizations if it has not been sent yet.     [facility]NC Sent Order to Rendering Facility:  Yes  *Please fax order as soon as done with this encounter if answering no.    What Facility was the order sent to (if multiple locations please specify address):   [facility] in [place]       Type of scan ordered:  MRI pelvis wo/w contrast    Diagnosis ICD10 Code:  C20  K62.4    CPT Code:  27802     Ordering Provider:  Dr. Celestino     Scan needed by date, if unknown please put approximate date:  02/26/24

## 2024-02-08 ENCOUNTER — Encounter: Admit: 2024-02-08 | Discharge: 2024-02-08 | Payer: MEDICARE

## 2024-02-08 NOTE — Telephone Encounter
 Faxed 02/08/2024 2:39 PM     To: [facility], Fax: [phone]  FROM: Sam, RN/ Dr. Norleen Mound [facility]: [phone] / FAX: [phone]    PATIENT INFORMATION:  Name: Terri Eaton      DOB: Mar 06, 1966    Sex: Female     Primary Phone: [phone]  COVERAGE: [employer] CHOICE PPO MEDICARE    SUBJECT:  Please call patient to schedule.    Please fax report to [phone] and cloud images to the [place] [facility].

## 2024-02-23 ENCOUNTER — Encounter: Admit: 2024-02-23 | Discharge: 2024-02-23 | Payer: MEDICARE

## 2024-02-23 NOTE — Telephone Encounter
 I have called patient to see if she has gotten her MRI scheduled. I LVM and asked she call back to let me know.

## 2024-03-01 ENCOUNTER — Encounter: Admit: 2024-03-01 | Discharge: 2024-03-01 | Payer: MEDICARE

## 2024-03-14 ENCOUNTER — Encounter: Admit: 2024-03-14 | Discharge: 2024-03-14 | Payer: MEDICARE

## 2024-03-14 DIAGNOSIS — K624 Stenosis of anus and rectum: Secondary | ICD-10-CM

## 2024-03-14 DIAGNOSIS — C20 Malignant neoplasm of rectum: Principal | ICD-10-CM

## 2024-03-14 NOTE — Progress Notes
 Imaging report received for MRI on 8/15, I have requested images via cloud

## 2024-03-15 ENCOUNTER — Encounter: Admit: 2024-03-15 | Discharge: 2024-03-15 | Payer: MEDICARE

## 2024-03-15 NOTE — Progress Notes
 MRI negative.   Still recommend colonoscopy.     Damien HERO. Signe MSN, APRN, AGCNS-BC, AGPCNP-BC  Advanced Practice Provider  Colon and Rectal Surgery  Surgical Oncology  APP for Dr. Celestino, Dr. Gladis and Dr. Rowland  Pager: [phone]  Available via [facility] and [facility]

## 2024-03-15 NOTE — Telephone Encounter
 I have LVM for patient informing her that we recommend she have a colonoscopy done at her earliest convenience. I informed her she can have it done there local.

## 2024-03-18 ENCOUNTER — Encounter: Admit: 2024-03-18 | Discharge: 2024-03-18 | Payer: MEDICARE

## 2024-04-06 ENCOUNTER — Encounter: Admit: 2024-04-06 | Discharge: 2024-04-06 | Payer: MEDICARE

## 2024-05-03 ENCOUNTER — Encounter: Admit: 2024-05-03 | Discharge: 2024-05-03 | Payer: MEDICARE

## 2024-05-03 NOTE — Telephone Encounter
 Patient called because she is unable to get scheduled for colonoscopy closer to home.     I have called the [facility] in [place] where referral was sent by Dr. Waddell (PCP). They informed me it was declined because she is established at [facility]. I have informed them that patient requested to be seen closer to home as she has difficulty with transport. They will review again and let Dr. Brian office know.    I have informed Kristin at Dr. Brian office. We are both in agree this is not a reason to decline colonoscopy. She will follow up on referral.    I have notified pt via voicemail.

## 2024-05-18 ENCOUNTER — Encounter: Admit: 2024-05-18 | Discharge: 2024-05-18 | Payer: MEDICARE

## 2024-05-18 NOTE — Progress Notes
 Pt scheduled for visit with Damien Freund on 05/24/24.  It was noted in Care Everywhere that MRI Pelvis was completed 03/11/2024.  Request sent to [facility] to cloud images. 05/18/2024 10:15 AM

## 2024-05-19 ENCOUNTER — Encounter: Admit: 2024-05-19 | Discharge: 2024-05-19 | Payer: MEDICARE

## 2024-05-24 ENCOUNTER — Encounter: Admit: 2024-05-24 | Discharge: 2024-05-24 | Payer: MEDICARE

## 2024-06-06 ENCOUNTER — Encounter: Admit: 2024-06-06 | Discharge: 2024-06-06 | Payer: MEDICARE

## 2024-08-17 ENCOUNTER — Encounter: Admit: 2024-08-17 | Discharge: 2024-08-17 | Payer: MEDICARE

## 2024-08-17 NOTE — Telephone Encounter [36]
 After review of records it looks as if patient can't find anywhere closer to home to do colonoscopy. I have called patient to see about scheduling with Dr. Celestino. I was unable to reach and mailbox is full. I will try again later this week.

## 2024-08-17 NOTE — Telephone Encounter [36]
 Patient called out of office scheduling and informed them she is needing to reschedule her 1/23 appt. She is till working on getting a colonoscopy close to home. Patient and I have discussed this many times. She informed scheduling she will call back when she has colonoscopy scheduled.

## 2024-08-30 ENCOUNTER — Encounter: Admit: 2024-08-30 | Discharge: 2024-08-30 | Payer: MEDICARE
